# Patient Record
Sex: Female | Born: 1985 | Race: Black or African American | Hispanic: No | Marital: Single | Smoking: Former smoker
Health system: Southern US, Community
[De-identification: ages and names within clinical notes are randomized; demographics above are authoritative.]

## PROBLEM LIST (undated history)

## (undated) ENCOUNTER — Inpatient Hospital Stay (HOSPITAL_COMMUNITY): Payer: Self-pay

## (undated) DIAGNOSIS — D649 Anemia, unspecified: Secondary | ICD-10-CM

## (undated) DIAGNOSIS — F419 Anxiety disorder, unspecified: Secondary | ICD-10-CM

## (undated) HISTORY — PX: WISDOM TOOTH EXTRACTION: SHX21

---
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
## 2007-01-11 ENCOUNTER — Inpatient Hospital Stay (HOSPITAL_COMMUNITY): Admission: AD | Admit: 2007-01-11 | Discharge: 2007-01-11 | Payer: Self-pay | Admitting: Obstetrics

## 2007-01-24 ENCOUNTER — Inpatient Hospital Stay (HOSPITAL_COMMUNITY): Admission: AD | Admit: 2007-01-24 | Discharge: 2007-01-24 | Payer: Self-pay | Admitting: Obstetrics

## 2007-01-26 ENCOUNTER — Inpatient Hospital Stay (HOSPITAL_COMMUNITY): Admission: AD | Admit: 2007-01-26 | Discharge: 2007-01-29 | Payer: Self-pay | Admitting: Obstetrics

## 2007-04-12 ENCOUNTER — Inpatient Hospital Stay (HOSPITAL_COMMUNITY): Admission: AD | Admit: 2007-04-12 | Discharge: 2007-04-12 | Payer: Self-pay | Admitting: Obstetrics

## 2007-10-13 ENCOUNTER — Emergency Department (HOSPITAL_COMMUNITY): Admission: EM | Admit: 2007-10-13 | Discharge: 2007-10-13 | Payer: Self-pay | Admitting: Emergency Medicine

## 2007-12-11 ENCOUNTER — Inpatient Hospital Stay (HOSPITAL_COMMUNITY): Admission: AD | Admit: 2007-12-11 | Discharge: 2007-12-11 | Payer: Self-pay | Admitting: Obstetrics and Gynecology

## 2008-03-01 ENCOUNTER — Emergency Department (HOSPITAL_COMMUNITY): Admission: EM | Admit: 2008-03-01 | Discharge: 2008-03-01 | Payer: Self-pay | Admitting: Emergency Medicine

## 2008-03-30 ENCOUNTER — Emergency Department (HOSPITAL_COMMUNITY): Admission: EM | Admit: 2008-03-30 | Discharge: 2008-03-30 | Payer: Self-pay | Admitting: Emergency Medicine

## 2008-10-10 ENCOUNTER — Emergency Department (HOSPITAL_COMMUNITY): Admission: EM | Admit: 2008-10-10 | Discharge: 2008-10-10 | Payer: Self-pay | Admitting: Emergency Medicine

## 2009-04-12 ENCOUNTER — Emergency Department (HOSPITAL_COMMUNITY): Admission: EM | Admit: 2009-04-12 | Discharge: 2009-04-12 | Payer: Self-pay | Admitting: Emergency Medicine

## 2009-12-22 ENCOUNTER — Emergency Department (HOSPITAL_COMMUNITY): Admission: EM | Admit: 2009-12-22 | Discharge: 2009-12-22 | Payer: Self-pay | Admitting: Emergency Medicine

## 2010-03-31 ENCOUNTER — Ambulatory Visit: Payer: Self-pay | Admitting: Advanced Practice Midwife

## 2010-03-31 ENCOUNTER — Inpatient Hospital Stay (HOSPITAL_COMMUNITY): Admission: AD | Admit: 2010-03-31 | Discharge: 2010-03-31 | Payer: Self-pay | Admitting: Obstetrics & Gynecology

## 2010-04-22 ENCOUNTER — Ambulatory Visit: Payer: Self-pay | Admitting: Advanced Practice Midwife

## 2010-04-22 ENCOUNTER — Inpatient Hospital Stay (HOSPITAL_COMMUNITY): Admission: AD | Admit: 2010-04-22 | Discharge: 2010-04-22 | Payer: Self-pay | Admitting: Obstetrics and Gynecology

## 2010-05-03 ENCOUNTER — Encounter: Payer: Self-pay | Admitting: Obstetrics and Gynecology

## 2010-05-03 ENCOUNTER — Ambulatory Visit: Payer: Self-pay | Admitting: Obstetrics and Gynecology

## 2010-05-03 LAB — CONVERTED CEMR LAB
Amphetamine Screen, Ur: NEGATIVE
Barbiturate Quant, Ur: NEGATIVE
Benzodiazepines.: NEGATIVE
Marijuana Metabolite: NEGATIVE
Methadone: NEGATIVE
Pap Smear: NEGATIVE
Propoxyphene: NEGATIVE

## 2010-05-04 ENCOUNTER — Encounter: Payer: Self-pay | Admitting: Obstetrics and Gynecology

## 2010-05-04 LAB — CONVERTED CEMR LAB

## 2010-05-17 ENCOUNTER — Ambulatory Visit: Payer: Self-pay | Admitting: Obstetrics and Gynecology

## 2010-05-19 ENCOUNTER — Inpatient Hospital Stay (HOSPITAL_COMMUNITY)
Admission: AD | Admit: 2010-05-19 | Discharge: 2010-05-19 | Payer: Self-pay | Source: Home / Self Care | Attending: Obstetrics and Gynecology | Admitting: Obstetrics and Gynecology

## 2010-05-19 ENCOUNTER — Inpatient Hospital Stay (HOSPITAL_COMMUNITY)
Admission: AD | Admit: 2010-05-19 | Discharge: 2010-05-21 | Disposition: A | Discharge status: Still In Hospital | Payer: Self-pay | Source: Home / Self Care | Attending: Obstetrics & Gynecology | Admitting: Obstetrics & Gynecology

## 2010-05-24 ENCOUNTER — Ambulatory Visit: Payer: Self-pay | Admitting: Obstetrics and Gynecology

## 2010-06-16 ENCOUNTER — Ambulatory Visit
Admission: RE | Admit: 2010-06-16 | Discharge: 2010-06-16 | Payer: Self-pay | Source: Home / Self Care | Attending: Obstetrics and Gynecology | Admitting: Obstetrics and Gynecology

## 2010-08-21 LAB — CBC
HCT: 27.6 % — ABNORMAL LOW (ref 36.0–46.0)
Hemoglobin: 9.1 g/dL — ABNORMAL LOW (ref 12.0–15.0)
MCV: 86.9 fL (ref 78.0–100.0)
RBC: 3.18 MIL/uL — ABNORMAL LOW (ref 3.87–5.11)
RDW: 13.1 % (ref 11.5–15.5)
WBC: 10.3 10*3/uL (ref 4.0–10.5)

## 2010-08-21 LAB — POCT URINALYSIS DIPSTICK
Bilirubin Urine: NEGATIVE
Glucose, UA: NEGATIVE mg/dL
Ketones, ur: NEGATIVE mg/dL
Protein, ur: NEGATIVE mg/dL
Specific Gravity, Urine: 1.02 (ref 1.005–1.030)

## 2010-08-22 LAB — POCT URINALYSIS DIPSTICK
Bilirubin Urine: NEGATIVE
Glucose, UA: NEGATIVE mg/dL
Hgb urine dipstick: NEGATIVE
Nitrite: NEGATIVE
Specific Gravity, Urine: 1.02 (ref 1.005–1.030)
Urobilinogen, UA: 0.2 mg/dL (ref 0.0–1.0)

## 2010-08-22 LAB — URINE MICROSCOPIC-ADD ON

## 2010-08-22 LAB — URINALYSIS, ROUTINE W REFLEX MICROSCOPIC
Glucose, UA: NEGATIVE mg/dL
Protein, ur: NEGATIVE mg/dL
Specific Gravity, Urine: >1.03 — ABNORMAL HIGH (ref 1.005–1.030)
pH: 6 (ref 5.0–8.0)

## 2010-08-22 LAB — URINE CULTURE

## 2010-08-23 LAB — CBC
HCT: 26.7 % — ABNORMAL LOW (ref 36.0–46.0)
Hemoglobin: 9.1 g/dL — ABNORMAL LOW (ref 12.0–15.0)
MCH: 31.4 pg (ref 26.0–34.0)
MCV: 92.3 fL (ref 78.0–100.0)
RBC: 2.9 MIL/uL — ABNORMAL LOW (ref 3.87–5.11)
WBC: 9.3 10*3/uL (ref 4.0–10.5)

## 2010-08-23 LAB — RAPID URINE DRUG SCREEN, HOSP PERFORMED
Barbiturates: NOT DETECTED
Opiates: NOT DETECTED

## 2010-08-23 LAB — URINALYSIS, ROUTINE W REFLEX MICROSCOPIC
Glucose, UA: NEGATIVE mg/dL
Protein, ur: NEGATIVE mg/dL
Specific Gravity, Urine: 1.02 (ref 1.005–1.030)
Urobilinogen, UA: 1 mg/dL (ref 0.0–1.0)

## 2010-08-23 LAB — SICKLE CELL SCREEN: Sickle Cell Screen: NEGATIVE

## 2010-08-23 LAB — HEPATITIS B SURFACE ANTIGEN: Hepatitis B Surface Ag: NEGATIVE

## 2010-08-23 LAB — DIFFERENTIAL
Eosinophils Relative: 1 % (ref 0–5)
Lymphocytes Relative: 16 % (ref 12–46)
Lymphs Abs: 1.5 10*3/uL (ref 0.7–4.0)
Monocytes Absolute: 0.8 10*3/uL (ref 0.1–1.0)
Monocytes Relative: 8 % (ref 3–12)

## 2010-10-16 ENCOUNTER — Inpatient Hospital Stay (HOSPITAL_COMMUNITY)
Admission: AD | Admit: 2010-10-16 | Discharge: 2010-10-16 | Disposition: A | Discharge status: Home / Self Care | Payer: Self-pay | Source: Ambulatory Visit | Attending: Obstetrics & Gynecology | Admitting: Obstetrics & Gynecology

## 2010-10-16 DIAGNOSIS — N760 Acute vaginitis: Secondary | ICD-10-CM | POA: Insufficient documentation

## 2010-10-16 DIAGNOSIS — B9689 Other specified bacterial agents as the cause of diseases classified elsewhere: Secondary | ICD-10-CM | POA: Insufficient documentation

## 2010-10-16 DIAGNOSIS — N949 Unspecified condition associated with female genital organs and menstrual cycle: Secondary | ICD-10-CM | POA: Insufficient documentation

## 2010-10-16 DIAGNOSIS — A499 Bacterial infection, unspecified: Secondary | ICD-10-CM

## 2010-10-16 LAB — WET PREP, GENITAL: Trich, Wet Prep: NONE SEEN

## 2010-10-17 LAB — GC/CHLAMYDIA PROBE AMP, GENITAL: Chlamydia, DNA Probe: NEGATIVE

## 2011-03-12 LAB — URINALYSIS, ROUTINE W REFLEX MICROSCOPIC
Glucose, UA: NEGATIVE
Ketones, ur: NEGATIVE
Protein, ur: 30 — AB

## 2011-03-12 LAB — COMPREHENSIVE METABOLIC PANEL
AST: 19
Albumin: 3.8
BUN: 13
Calcium: 8.9
Chloride: 105
Creatinine, Ser: 0.67
GFR calc Af Amer: >60
Total Protein: 6.6

## 2011-03-12 LAB — URINE CULTURE: Colony Count: >=100000

## 2011-03-12 LAB — DIFFERENTIAL
Basophils Absolute: 0
Eosinophils Relative: 0
Lymphocytes Relative: 13
Lymphs Abs: 1.4
Monocytes Absolute: 0.5
Monocytes Relative: 5
Neutro Abs: 9.2 — ABNORMAL HIGH

## 2011-03-12 LAB — URINE MICROSCOPIC-ADD ON

## 2011-03-12 LAB — CBC
HCT: 35.6 — ABNORMAL LOW
MCHC: 32.5
MCV: 87.4
Platelets: 153
RDW: 13.9
WBC: 11.1 — ABNORMAL HIGH

## 2011-03-20 LAB — URINALYSIS, ROUTINE W REFLEX MICROSCOPIC
Hgb urine dipstick: NEGATIVE
Specific Gravity, Urine: >1.03 — ABNORMAL HIGH
Urobilinogen, UA: 0.2
pH: 6

## 2011-03-20 LAB — POCT PREGNANCY, URINE
Operator id: 117411
Preg Test, Ur: NEGATIVE

## 2011-03-23 LAB — CBC
HCT: 26.3 — ABNORMAL LOW
MCHC: 34
MCHC: 34
MCV: 84.9
MCV: 85.6
Platelets: 226
RBC: 3.48 — ABNORMAL LOW
RDW: 13.1

## 2011-03-23 LAB — RPR: RPR Ser Ql: NONREACTIVE

## 2011-03-26 LAB — URINE CULTURE
Colony Count: NO GROWTH
Culture: NO GROWTH

## 2011-03-26 LAB — URINALYSIS, ROUTINE W REFLEX MICROSCOPIC
Bilirubin Urine: NEGATIVE
Ketones, ur: NEGATIVE
Ketones, ur: 15 — AB
Nitrite: NEGATIVE
Nitrite: NEGATIVE
Protein, ur: NEGATIVE
Protein, ur: NEGATIVE
Specific Gravity, Urine: <1.005 — ABNORMAL LOW
Urobilinogen, UA: 0.2

## 2011-03-28 LAB — URINALYSIS, ROUTINE W REFLEX MICROSCOPIC
Bilirubin Urine: NEGATIVE
Hgb urine dipstick: NEGATIVE
Ketones, ur: NEGATIVE
Nitrite: NEGATIVE
Urobilinogen, UA: 0.2

## 2012-03-27 ENCOUNTER — Emergency Department (HOSPITAL_COMMUNITY)
Admission: EM | Admit: 2012-03-27 | Discharge: 2012-03-27 | Disposition: A | Discharge status: Home / Self Care | Payer: Self-pay | Attending: Emergency Medicine | Admitting: Emergency Medicine

## 2012-03-27 DIAGNOSIS — H00019 Hordeolum externum unspecified eye, unspecified eyelid: Secondary | ICD-10-CM | POA: Insufficient documentation

## 2012-03-27 DIAGNOSIS — H5710 Ocular pain, unspecified eye: Secondary | ICD-10-CM | POA: Insufficient documentation

## 2012-03-27 MED ORDER — ERYTHROMYCIN 5 MG/GM OP OINT
TOPICAL_OINTMENT | Freq: Once | OPHTHALMIC | Status: AC
  Administered 2012-03-27: 17:00:00 via OPHTHALMIC
  Filled 2012-03-27: qty 3.5

## 2012-03-27 NOTE — ED Notes (Signed)
Pt verbalizes understanding 

## 2012-03-27 NOTE — ED Notes (Signed)
Pt present with right eye ? stye.  Pt reports itching and burning.  Pt denies drainage or blurry vision.

## 2012-03-27 NOTE — ED Provider Notes (Signed)
History     CSN: [id number]  Arrival date & time 03/27/12  1442   First MD Initiated Contact with Patient 03/27/12 1548      Chief Complaint  Patient presents with  . Eye Pain    (Consider location/radiation/quality/duration/timing/severity/associated sxs/prior treatment) HPI Comments: Patient presents with complaint of right upper eyelid swelling for the past 2-3 days. It is itchy. She does not wear contact lenses and does not think she has gotten a foreign body in her eye. It has not been painful. No drainage from the area. She denies redness of her eyes or blurry vision. No crusting or drainage. Patient has had styes in the past that have resolved spontaneously. This is more severe than previous. Patient has used Visine but this has not helped. Onset gradual. Course is constant. Nothing makes symptoms better or worse.  The history is provided by the patient.    No past medical history on file.  No past surgical history on file.  No family history on file.  History  Substance Use Topics  . Smoking status: Not on file  . Smokeless tobacco: Not on file  . Alcohol Use: Not on file    OB History    No data available      Review of Systems  Constitutional: Negative for fever.  Eyes: Positive for itching. Negative for photophobia, pain, discharge, redness and visual disturbance.  Gastrointestinal: Negative for nausea and vomiting.  Skin: Negative for color change.  Neurological: Negative for headaches.    Allergies  Review of patient's allergies indicates no known allergies.  Home Medications   Current Outpatient Rx  Name Route Sig Dispense Refill  . ACETAMINOPHEN 500 MG PO TABS Oral Take 500 mg by mouth every 6 (six) hours as needed. pain    . MEDROXYPROGESTERONE ACETATE 150 MG/ML IM SUSP Intramuscular Inject 150 mg into the muscle every 3 (three) months.    . TETRAHYDROZOLINE HCL 0.05 % OP SOLN Right Eye Place 1 drop into the right eye 2 (two) times daily as  needed. Redness      BP 101/74  Pulse 94  Temp 97.9 F (36.6 C) (Oral)  Resp 14  SpO2 100%  Physical Exam  Nursing note and vitals reviewed. Constitutional: She appears well-developed and well-nourished.  HENT:  Head: Normocephalic and atraumatic.  Eyes: Conjunctivae normal and EOM are normal. Pupils are equal, round, and reactive to light. Right eye exhibits hordeolum. No foreign body present in the right eye. Left eye exhibits no hordeolum. No foreign body present in the left eye. Right conjunctiva is not injected. Left conjunctiva is not injected.    Neck: Normal range of motion. Neck supple.  Pulmonary/Chest: No respiratory distress.  Neurological: She is alert.  Skin: Skin is warm and dry.  Psychiatric: She has a normal mood and affect.    ED Course  Procedures (including critical care time)  Labs Reviewed - No data to display No results found.   1. Stye    4:49 PM Patient seen and examined.    Vital signs reviewed and are as follows: Filed Vitals:   03/27/12 1529  BP: 101/74  Pulse: 94  Temp: 97.9 F (36.6 C)  Resp: 14   Counseled on use of warm compresses.   Patient counseled to use tobrex as follows: 0.25 inch ribbon in affected eye(s) up to 6 times a day.  Patient urged to return with worsening symptoms or other concerns. Patient verbalized understanding and agrees with plan.  MDM  Stye, right upper eyelid. No s/s of periorbital cellulitis. Eye exam otherwise normal.         Renne Crigler, PA 03/27/12 1654

## 2012-03-29 NOTE — ED Provider Notes (Signed)
Medical screening examination/treatment/procedure(s) were performed by non-physician practitioner and as supervising physician I was immediately available for consultation/collaboration.   Hurman Horn, MD 03/29/12 1539

## 2012-07-29 ENCOUNTER — Encounter (HOSPITAL_COMMUNITY): Payer: Self-pay | Admitting: Emergency Medicine

## 2012-07-29 ENCOUNTER — Emergency Department (HOSPITAL_COMMUNITY)
Admission: EM | Admit: 2012-07-29 | Discharge: 2012-07-29 | Discharge status: Against Medical Advice | Payer: Self-pay | Attending: Emergency Medicine | Admitting: Emergency Medicine

## 2012-07-29 DIAGNOSIS — R0602 Shortness of breath: Secondary | ICD-10-CM | POA: Insufficient documentation

## 2012-07-29 DIAGNOSIS — R109 Unspecified abdominal pain: Secondary | ICD-10-CM | POA: Insufficient documentation

## 2012-07-29 DIAGNOSIS — F17200 Nicotine dependence, unspecified, uncomplicated: Secondary | ICD-10-CM | POA: Insufficient documentation

## 2012-07-29 DIAGNOSIS — R197 Diarrhea, unspecified: Secondary | ICD-10-CM | POA: Insufficient documentation

## 2012-07-29 DIAGNOSIS — R110 Nausea: Secondary | ICD-10-CM | POA: Insufficient documentation

## 2012-07-29 DIAGNOSIS — Z3202 Encounter for pregnancy test, result negative: Secondary | ICD-10-CM | POA: Insufficient documentation

## 2012-07-29 DIAGNOSIS — R079 Chest pain, unspecified: Secondary | ICD-10-CM | POA: Insufficient documentation

## 2012-07-29 LAB — URINALYSIS, ROUTINE W REFLEX MICROSCOPIC
Bilirubin Urine: NEGATIVE
Hgb urine dipstick: NEGATIVE
Protein, ur: NEGATIVE mg/dL
Urobilinogen, UA: 0.2 mg/dL (ref 0.0–1.0)

## 2012-07-29 LAB — URINE MICROSCOPIC-ADD ON

## 2012-07-29 NOTE — ED Notes (Signed)
Pt c/o diarrhea and nausea starting yesterday

## 2012-07-29 NOTE — ED Notes (Signed)
Pt stated she had to go pick up her children from school and had to leave.

## 2012-07-30 ENCOUNTER — Emergency Department (HOSPITAL_COMMUNITY): Admit: 2012-07-30 | Discharge: 2012-07-30 | Disposition: A | Discharge status: Home / Self Care | Payer: Self-pay

## 2012-07-30 ENCOUNTER — Encounter (HOSPITAL_COMMUNITY): Payer: Self-pay | Admitting: Emergency Medicine

## 2012-07-30 ENCOUNTER — Emergency Department (HOSPITAL_COMMUNITY)
Admission: EM | Admit: 2012-07-30 | Discharge: 2012-07-30 | Discharge status: Against Medical Advice | Payer: Self-pay | Attending: Emergency Medicine | Admitting: Emergency Medicine

## 2012-07-30 DIAGNOSIS — R079 Chest pain, unspecified: Secondary | ICD-10-CM

## 2012-07-30 LAB — CBC WITH DIFFERENTIAL/PLATELET
Basophils Absolute: 0 10*3/uL (ref 0.0–0.1)
Basophils Relative: 1 % (ref 0–1)
Eosinophils Absolute: 0.1 10*3/uL (ref 0.0–0.7)
Eosinophils Relative: 2 % (ref 0–5)
Lymphs Abs: 3 10*3/uL (ref 0.7–4.0)
MCH: 29.6 pg (ref 26.0–34.0)
MCV: 90.1 fL (ref 78.0–100.0)
Neutrophils Relative %: 52 % (ref 43–77)
Platelets: 203 10*3/uL (ref 150–400)
RBC: 4.43 MIL/uL (ref 3.87–5.11)
RDW: 13.2 % (ref 11.5–15.5)

## 2012-07-30 LAB — COMPREHENSIVE METABOLIC PANEL
AST: 15 U/L (ref 0–37)
Alkaline Phosphatase: 76 U/L (ref 39–117)
BUN: 15 mg/dL (ref 6–23)
CO2: 28 mEq/L (ref 19–32)
Chloride: 106 mEq/L (ref 96–112)
Creatinine, Ser: 0.85 mg/dL (ref 0.50–1.10)
GFR calc non Af Amer: >90 mL/min (ref >90)
Potassium: 4.2 mEq/L (ref 3.5–5.1)
Total Bilirubin: 0.2 mg/dL — ABNORMAL LOW (ref 0.3–1.2)

## 2012-07-30 LAB — POCT I-STAT TROPONIN I: Troponin i, poc: 0 ng/mL (ref 0.00–0.08)

## 2012-07-30 MED ORDER — SODIUM CHLORIDE 0.9 % IV BOLUS (SEPSIS): 500.0000 mL | Freq: Once | INTRAVENOUS | Status: DC

## 2012-07-30 MED ORDER — GI COCKTAIL ~~LOC~~
30.0000 mL | Freq: Once | ORAL | Status: AC
  Administered 2012-07-30: 30 mL via ORAL
  Filled 2012-07-30: qty 30

## 2012-07-30 MED ORDER — PANTOPRAZOLE SODIUM 40 MG PO TBEC
40.0000 mg | DELAYED_RELEASE_TABLET | Freq: Once | ORAL | Status: AC
  Administered 2012-07-30: 40 mg via ORAL
  Filled 2012-07-30: qty 1

## 2012-07-30 MED ORDER — ESOMEPRAZOLE MAGNESIUM 40 MG PO CPDR: 40.0000 mg | DELAYED_RELEASE_CAPSULE | Freq: Every day | ORAL | Status: DC

## 2012-07-30 NOTE — ED Notes (Signed)
PT. REPORTS INTERMITTENT LEFT CHEST PAIN WITH SLIGHT SOB AND NAUSEA ONSET THIS EVENING , ALSO REPORTS DIARRHEA LAST NIGHT.

## 2012-07-30 NOTE — ED Notes (Signed)
Patient states that she has to go home and cannot wait for the lab results to come back.  Patient states that there is no one to watch her baby and she must go home.  Explained to patient risk of her going home and symptoms getting worse-possibility of death.  Patient verbalized understanding of the risk and states that she will return if her symptoms worsen.  MD updated.

## 2012-07-30 NOTE — ED Provider Notes (Signed)
History     CSN: [id number]  Arrival date & time 07/29/12  2346   First MD Initiated Contact with Patient 07/30/12 0038      Chief Complaint  Patient presents with  . Chest Pain    (Consider location/radiation/quality/duration/timing/severity/associated sxs/prior treatment) Patient is a 27 y.o. female presenting with chest pain. The history is provided by the patient.  Chest Pain Pain location:  Substernal area Pain quality: aching   Pain radiates to:  Does not radiate Pain radiates to the back: no   Pain severity:  Mild Onset quality:  Gradual Duration:  1 day Timing:  Constant Progression:  Partially resolved Chronicity:  New Context: breathing   Relieved by:  None tried Worsened by:  Nothing tried Ineffective treatments:  None tried Associated symptoms: abdominal pain   Risk factors: birth control     History reviewed. No pertinent past medical history.  History reviewed. No pertinent past surgical history.  No family history on file.  History  Substance Use Topics  . Smoking status: Current Every Day Smoker  . Smokeless tobacco: Not on file  . Alcohol Use: Yes     Comment: occ    OB History   Grav Para Term Preterm Abortions TAB SAB Ect Mult Living                  Review of Systems  Cardiovascular: Positive for chest pain.  Gastrointestinal: Positive for abdominal pain.  All other systems reviewed and are negative.    Allergies  Review of patient's allergies indicates no known allergies.  Home Medications   Current Outpatient Rx  Name  Route  Sig  Dispense  Refill  . acetaminophen (TYLENOL) 500 MG tablet   Oral   Take 500 mg by mouth every 6 (six) hours as needed. pain         . medroxyPROGESTERone (DEPO-PROVERA) 150 MG/ML injection   Intramuscular   Inject 150 mg into the muscle every 3 (three) months.           BP 107/71  Pulse 103  Temp(Src) 98.5 F (36.9 C) (Oral)  Resp 14  SpO2 100%  Physical Exam  Constitutional:  She is oriented to person, place, and time. She appears well-developed and well-nourished.  HENT:  Head: Normocephalic and atraumatic.  Eyes: Conjunctivae and EOM are normal. Pupils are equal, round, and reactive to light.  Neck: Normal range of motion.  Cardiovascular: Normal rate, regular rhythm and normal heart sounds.   Pulmonary/Chest: Effort normal and breath sounds normal.  Abdominal: Soft. Bowel sounds are normal.  Musculoskeletal: Normal range of motion.  Neurological: She is alert and oriented to person, place, and time.  Skin: Skin is warm and dry.  Psychiatric: She has a normal mood and affect. Her behavior is normal.    ED Course  Procedures (including critical care time)  Labs Reviewed  COMPREHENSIVE METABOLIC PANEL - Abnormal; Notable for the following:    Total Bilirubin 0.2 (*)    All other components within normal limits  CBC WITH DIFFERENTIAL  D-DIMER, QUANTITATIVE   Dg Chest 2 View  07/30/2012  *RADIOLOGY REPORT*  Clinical Data: Chest pain  CHEST - 2 VIEW  Comparison: None.  Findings: The lungs are well-aerated and free from pulmonary edema, focal airspace consolidation or pulmonary nodule.  Cardiac and mediastinal contours are within normal limits.  No pneumothorax, or pleural effusion. No acute osseous findings.  IMPRESSION:  No acute cardiopulmonary disease.   Original Report Authenticated By:  Malachy Moan, M.D.      No diagnosis found.    Date: 07/30/2012  Rate: 95  Rhythm: normal sinus rhythm  QRS Axis: normal  Intervals: normal  ST/T Wave abnormalities: normal  Conduction Disutrbances: none  Narrative Interpretation: unremarkable    MDM  + nausea,  Diarrhea, sob, cp, on ocp,  Will d-dimer, ivf,  reassess        Izell Labat Lytle Michaels, MD 07/30/12 0128

## 2012-07-31 LAB — URINE CULTURE

## 2012-08-05 ENCOUNTER — Telehealth (HOSPITAL_COMMUNITY): Payer: Self-pay | Admitting: Emergency Medicine

## 2012-08-05 NOTE — ED Notes (Signed)
Chart reviewed by Dr Effie Shy "Call in Rx Keflex 500 mg QID, # 28 until gone".. Pt informed of Dx and need for addl tx.  Rx called to [facility] pharmacist @ [phone].

## 2015-09-06 ENCOUNTER — Emergency Department (HOSPITAL_COMMUNITY)
Admission: EM | Admit: 2015-09-06 | Discharge: 2015-09-06 | Disposition: A | Discharge status: Home / Self Care | Payer: 59 | Attending: Emergency Medicine | Admitting: Emergency Medicine

## 2015-09-06 ENCOUNTER — Encounter (HOSPITAL_COMMUNITY): Payer: Self-pay

## 2015-09-06 DIAGNOSIS — R200 Anesthesia of skin: Secondary | ICD-10-CM | POA: Insufficient documentation

## 2015-09-06 DIAGNOSIS — F17200 Nicotine dependence, unspecified, uncomplicated: Secondary | ICD-10-CM | POA: Diagnosis not present

## 2015-09-06 DIAGNOSIS — F410 Panic disorder [episodic paroxysmal anxiety] without agoraphobia: Secondary | ICD-10-CM | POA: Diagnosis not present

## 2015-09-06 DIAGNOSIS — R109 Unspecified abdominal pain: Secondary | ICD-10-CM | POA: Insufficient documentation

## 2015-09-06 NOTE — ED Notes (Signed)
Per EMS, pt from home.  Pt found anxious on floor.  Pt was c/o extremity numbness with abdominal discomfort.  Mom took kids and told EMS that pt may have been drinking.  Vitals stable within route.  Vitals: 132/96, hr 100, resp 40,

## 2015-09-06 NOTE — ED Notes (Signed)
Pt not found in room and had left.  MD notified.

## 2015-09-09 ENCOUNTER — Ambulatory Visit (INDEPENDENT_AMBULATORY_CARE_PROVIDER_SITE_OTHER): Payer: 59 | Admitting: Physician Assistant

## 2015-09-09 VITALS — BP 120/76 | HR 78 | Temp 98.6°F | Resp 16

## 2015-09-09 DIAGNOSIS — F410 Panic disorder [episodic paroxysmal anxiety] without agoraphobia: Secondary | ICD-10-CM

## 2015-09-09 DIAGNOSIS — G4700 Insomnia, unspecified: Secondary | ICD-10-CM | POA: Diagnosis not present

## 2015-09-09 DIAGNOSIS — F430 Acute stress reaction: Principal | ICD-10-CM

## 2015-09-09 MED ORDER — HYDROXYZINE HCL 25 MG PO TABS: ORAL_TABLET | ORAL | Status: DC

## 2015-09-09 NOTE — Progress Notes (Signed)
[facility] [address], [place] [address] [phone]  Date:  09/09/2015   Name:  Donna Lowery   DOB:  08/13/1985   MRN:  [id number]  PCP:  Default, Provider, MD    Chief Complaint: Anxiety   History of Present Illness:  This is a 30 y.o. female with PMH tobacco abuse who is presenting with anxiety. States she had a panic attack 3 days ago for the first time ever. She had sob and started hyperventilating. Her son called 911. She was transported to [facility] long ED. By the time she got there, she was able to regulate her breathing and assumed she had just had a panic attack. She decided to leave before being seen. Last night she had another panic attack but states it was not as bad. She states she has been under a lot of stress lately. Her grandmother died 1 year ago and they are finally getting trying to get her house packed and sold. Her mother and sister recently moved in with her and she states "I'm more like a mother to both of them". She has two children of her own as well. She states her mood is overall good but states she is always worrying and making to-do-lists. Over the past couple months her sleeping patterns have worsened. She gets off work at 830 and is unable to fall asleep until 430 AM. She then has to get up at 630 AM to get her kids ready for school. She states she will stay up looking at her phone in bed. She has not tried anything for sleep. She states she is not a pill person and would prefer to not take anything.  She is wondering about getting medical leave for work, 3/28 - 4/10.   Review of Systems:  Review of Systems See HPI  There are no active problems to display for this patient.   Prior to Admission medications   Medication Sig Start Date End Date Taking? Authorizing Provider  levonorgestrel (MIRENA) 20 MCG/24HR IUD 1 each by Intrauterine route once. Ongoing   Yes Historical Provider, MD           No Known Allergies  History  reviewed. No pertinent past surgical history.  Social History  Substance Use Topics  . Smoking status: Current Every Day Smoker  . Smokeless tobacco: None  . Alcohol Use: Yes     Comment: occ    History reviewed. No pertinent family history.  Medication list has been reviewed and updated.  Physical Examination:  Physical Exam  Constitutional: She is oriented to person, place, and time. She appears well-developed and well-nourished. No distress.  HENT:  Head: Normocephalic and atraumatic.  Right Ear: Hearing normal.  Left Ear: Hearing normal.  Nose: Nose normal.  Eyes: Conjunctivae and lids are normal. Right eye exhibits no discharge. Left eye exhibits no discharge. No scleral icterus.  Pulmonary/Chest: Effort normal. No respiratory distress.  Musculoskeletal: Normal range of motion.  Neurological: She is alert and oriented to person, place, and time.  Skin: Skin is warm, dry and intact. No lesion and no rash noted.  Psychiatric: She has a normal mood and affect. Her speech is normal and behavior is normal. Thought content normal.    BP 120/76 mmHg  Pulse 78  Temp(Src) 98.6 F (37 C) (Oral)  Resp 16  SpO2 98%  Assessment and Plan:  1. Panic attack as reaction to stress 2. Insomnia  Pt is wary of medicine  and wants to try more conservative measures first. We discussed the importance of regular exercise and taking time for herself. We discussed meditation. Deep breathing if have a panic attack. Gave rx for vistaril to try prn panic attack. Counseled on sleep hygiene. May try melatonin 30 minutes prior to sleep. i am ok with giving short term FMLA. She will return with forms. Follow up in 1 month. - hydrOXYzine (ATARAX/VISTARIL) 25 MG tablet; Take 0.5-1 tablets po every 8 hours prn anxiety  Dispense: 30 tablet; Refill: 0   [place] Miners. Dyke Brackett, MHS [facility]  09/09/2015

## 2015-09-09 NOTE — Patient Instructions (Addendum)
Exercise most days, at least 4 days a week. Meditation can help. Sleep hygiene - no phone, computer, tv, reading in bed. Try to go to sleep for 20 minutes, if unable leave the bedroom. Return 20-30 minutes later and try again May take melatonin 5 mg 30 minutes before bed. If you have a panic attack, remember you are in control, focus on deep breathing Return with your FMLA forms. Return in 1 month to follow up.

## 2015-09-22 ENCOUNTER — Telehealth: Payer: Self-pay

## 2015-09-22 NOTE — Telephone Encounter (Signed)
FMLA completed and placed in FMLA box.

## 2015-09-22 NOTE — Telephone Encounter (Signed)
PATIENT STATES SHE SAW NICOLE BUSH ABOUT 2 WEEKS AGO FOR ANXIETY. SHE ASKED HER IF SHE WOULD LIKE TO TALK WITH A THERAPIST. SHE HAS DECIDED THAT SHE WOULD LIKE TO. BEST PHONE [phone] (CELL)  MBC

## 2015-09-22 NOTE — Telephone Encounter (Signed)
Patient needs FMLA forms completed by Lanier ClamNicole Bush, I have completed what I could from the OV notes from 09/09/15 and highlighted the areas that need to be completed. I will place them in your box on 09/22/15 please fill out and return to the FMLA/Disability box at the 102 checkout desk  Within 5-7 business days. Thank you!

## 2015-09-22 NOTE — Telephone Encounter (Signed)
For therapy -- [facility] ([address], Ernest McCoy, Heather Joycelyn SchmidKitchens, Karla Jordan [place]) - [phone] [facility] Raynelle Fanning([place]) - [phone] [facility] - [phone] [facility] - [phone] (do not file insurance) [facility] - accepts most major insurances. [phone] or email [email or web address]   You can give her the above contact info

## 2015-09-23 NOTE — Telephone Encounter (Signed)
Paperwork scanned and faxed to [employer] on 09/23/15

## 2015-09-23 NOTE — Telephone Encounter (Signed)
Pt called back asking about the previous message and i gave her the names for therapy that Joni Reiningicole listed

## 2015-10-25 ENCOUNTER — Telehealth: Payer: Self-pay

## 2015-10-25 NOTE — Telephone Encounter (Signed)
[employer] is requesting some more information on this patients Leave. I have completed what I could from the OV notes and the other FMLA forms I highlighted the areas that need to be updated. I will please it in your box on 10-25-15 if you could please return this to the FMLA/Disability box at the 102 check out desk with in 5-7 business day thank you.

## 2015-10-26 DIAGNOSIS — Z0271 Encounter for disability determination: Secondary | ICD-10-CM

## 2015-10-27 NOTE — Telephone Encounter (Signed)
Completed. In FMLA box.

## 2015-10-31 NOTE — Telephone Encounter (Signed)
Paperwork scanned in and faxed to company on 10/31/15

## 2016-11-12 ENCOUNTER — Encounter (HOSPITAL_COMMUNITY): Payer: Self-pay | Admitting: Emergency Medicine

## 2016-11-12 ENCOUNTER — Emergency Department (HOSPITAL_COMMUNITY)
Admission: EM | Admit: 2016-11-12 | Discharge: 2016-11-12 | Disposition: A | Discharge status: Home / Self Care | Payer: 59 | Attending: Emergency Medicine | Admitting: Emergency Medicine

## 2016-11-12 DIAGNOSIS — F419 Anxiety disorder, unspecified: Secondary | ICD-10-CM | POA: Insufficient documentation

## 2016-11-12 DIAGNOSIS — Z79899 Other long term (current) drug therapy: Secondary | ICD-10-CM | POA: Insufficient documentation

## 2016-11-12 DIAGNOSIS — F17200 Nicotine dependence, unspecified, uncomplicated: Secondary | ICD-10-CM | POA: Diagnosis not present

## 2016-11-12 HISTORY — DX: Anxiety disorder, unspecified: F41.9

## 2016-11-12 MED ORDER — HYDROXYZINE HCL 25 MG PO TABS: 25.0000 mg | ORAL_TABLET | Freq: Three times a day (TID) | ORAL | 0 refills | Status: DC | PRN

## 2016-11-12 NOTE — ED Triage Notes (Signed)
Pt reports "anxiety attack" yesterday, reports first symptoms since July 2017.  Pt reports palpitations with SOB, lasting about 20-30 minutes, reports all symptoms resolved at this time. Pt reports out of anxiety meds.  Resp e/u, NAD noted.

## 2016-11-12 NOTE — Discharge Instructions (Signed)
You have been seen today with complaint of anxiety. Your hydroxyzine has been renewed. A consult for social work and case management has been placed on your behalf. Resources attached to this discharge paperwork have been included for your benefit to give you a guide for securing a provider to manage this issue. Many of the resources listed perform functions for things like anxiety (pertinent to you) as well as substance abuse (not pertinent to you). Please contact them to work on setting up an appointment.

## 2016-11-12 NOTE — Discharge Planning (Signed)
[facility] regarding resources for anxiety.  EDSW will provide resources.

## 2016-11-12 NOTE — ED Provider Notes (Signed)
[facility]-EMERGENCY DEPT Provider Note   CSN: [id number] Arrival date & time: 11/12/16  0730     History   Chief Complaint Chief Complaint  Patient presents with  . Anxiety    HPI WRENLEY SAYED is a 31 y.o. female.  HPI   ERCELL PERLMAN is a 32 y.o. female, with a history of anxiety, presenting to the ED with complaint of an anxiety attack that occurred yesterday. She states she was washing dishes, began to breathe heavily, and her heart began to race. Lasted for about 20-30 minutes and then resolved completely. She has been under extra stress lately. Endorses insomnia. She denies current complaints. States she would like a referral to a provider to handle this. She states the provider she was seeing is now out of network for her. She inquires about [facility] services. Denies SI/HI. Denies illicit drug use. Occasional alcohol use. Denies A/V hallucinations.   Patient states she has had previous success with hydroxyzine.   Past Medical History:  Diagnosis Date  . Anxiety     There are no active problems to display for this patient.   History reviewed. No pertinent surgical history.  OB History    No data available       Home Medications    Prior to Admission medications   Medication Sig Start Date End Date Taking? Authorizing Provider  esomeprazole (NEXIUM) 40 MG capsule Take 1 capsule (40 mg total) by mouth daily. Patient not taking: Reported on 09/06/2015 07/30/12   Mendel Ryder, MD  hydrOXYzine (ATARAX/VISTARIL) 25 MG tablet Take 0.5-1 tablets po every 8 hours prn anxiety 09/09/15   Dorna Leitz, PA-C  hydrOXYzine (ATARAX/VISTARIL) 25 MG tablet Take 1 tablet (25 mg total) by mouth every 8 (eight) hours as needed for anxiety. 11/12/16   Terris Germano C, PA-C  levonorgestrel (MIRENA) 20 MCG/24HR IUD 1 each by Intrauterine route once. Ongoing    [provider]    Family History No family history on file.  Social  History Social History  Substance Use Topics  . Smoking status: Current Every Day Smoker    Packs/day: 0.50  . Smokeless tobacco: Never Used  . Alcohol use Yes     Comment: occ     Allergies   Patient has no known allergies.   Review of Systems Review of Systems  Respiratory: Positive for shortness of breath (resolved).   Gastrointestinal: Negative for nausea and vomiting.  Psychiatric/Behavioral: Negative for agitation, dysphoric mood, hallucinations and suicidal ideas. The patient is nervous/anxious (resolved).   All other systems reviewed and are negative.    Physical Exam Updated Vital Signs BP 116/80 (BP Location: Left Arm)   Pulse 89   Temp 97.7 F (36.5 C) (Oral)   Resp 16   Ht 5\' 5"  (1.651 m)   Wt 49.9 kg (110 lb)   SpO2 99%   BMI 18.30 kg/m   Physical Exam  Constitutional: She appears well-developed and well-nourished. No distress.  HENT:  Head: Normocephalic and atraumatic.  Eyes: Conjunctivae are normal.  Neck: Neck supple.  Cardiovascular: Normal rate, regular rhythm, normal heart sounds and intact distal pulses.   Pulmonary/Chest: Effort normal and breath sounds normal. No respiratory distress.  Abdominal: Soft. There is no tenderness. There is no guarding.  Musculoskeletal: She exhibits no edema.  Lymphadenopathy:    She has no cervical adenopathy.  Neurological: She is alert.  Skin: Skin is warm and dry. She is not diaphoretic.  Psychiatric: She  has a normal mood and affect. Her behavior is normal.  Nursing note and vitals reviewed.    ED Treatments / Results  Labs (all labs ordered are listed, but only abnormal results are displayed) Labs Reviewed - No data to display  EKG  EKG Interpretation None       Radiology No results found.  Procedures Procedures (including critical care time)  Medications Ordered in ED Medications - No data to display   Initial Impression / Assessment and Plan / ED Course  I have reviewed the  triage vital signs and the nursing notes.  Pertinent labs & imaging results that were available during my care of the patient were reviewed by me and considered in my medical decision making (see chart for details).     Patient presents with complaint of an anxiety attack yesterday. No current complaints during the ED course. Case manager versus social work consult placed to help patient with resources. Hydroxyzine renewed. The patient was given instructions for home care as well as return precautions. Patient voices understanding of these instructions, accepts the plan, and is comfortable with discharge.  Final Clinical Impressions(s) / ED Diagnoses   Final diagnoses:  Anxiety    New Prescriptions New Prescriptions   HYDROXYZINE (ATARAX/VISTARIL) 25 MG TABLET    Take 1 tablet (25 mg total) by mouth every 8 (eight) hours as needed for anxiety.     Anselm PancoastJoy, Latyra Jaye C, PA-C 11/12/16 [phone]    Linwood DibblesKnapp, Jon, MD 11/13/16 [phone]

## 2016-11-28 ENCOUNTER — Ambulatory Visit (INDEPENDENT_AMBULATORY_CARE_PROVIDER_SITE_OTHER): Payer: 59 | Admitting: Physician Assistant

## 2016-11-28 ENCOUNTER — Encounter: Payer: Self-pay | Admitting: Physician Assistant

## 2016-11-28 VITALS — BP 100/67 | HR 90 | Temp 98.3°F | Resp 16 | Ht 65.0 in | Wt 108.4 lb

## 2016-11-28 DIAGNOSIS — F411 Generalized anxiety disorder: Secondary | ICD-10-CM

## 2016-11-28 MED ORDER — CLONAZEPAM 0.5 MG PO TABS: 0.2500 mg | ORAL_TABLET | Freq: Two times a day (BID) | ORAL | 0 refills | Status: DC | PRN

## 2016-11-28 MED ORDER — CITALOPRAM HYDROBROMIDE 20 MG PO TABS: 20.0000 mg | ORAL_TABLET | Freq: Every day | ORAL | 1 refills | Status: DC

## 2016-11-28 NOTE — Progress Notes (Signed)
PRIMARY CARE AT [facility] [address] [place] [address] [phone]  Date:  11/28/2016   Name:  Donna Lowery   DOB:  May 07, 1986   MRN:  [id number]  PCP:  Default, Provider, MD    History of Present Illness:  Donna Lowery is a 31 y.o. female patient who presents to PCP with  Chief Complaint  Patient presents with  . Anxiety    per patient, has had more frequent anxiety attacks; has taken a leave from work     [employer], [place] she notes to be pcp.   She is having anxiety attacks back to back that started 4 weeks ago.  Attack symptoms include nausea, dizziness, and sob.  She has some triggers, but it can be just sitting and thinking.  She will be thkinking about death.  Her cousin passed passed 1-2 months.  She is unable to work due to feeling overwhelmed.  A disgruntled patient causes tearful anxiety attacks, and panic.  No si/hi.  She was seen at the ED 2.5 weeks ago for anxiety.  Released within hours.  Given hydroxyzine.  States it does not help. Notes change in sleep (less), interest, psychomotor retardation, less appetite, and energy.  She has not seen any counseling, she will see a therapist in 2 days.  She is given 8 sessions per year for her insurance. .   1 year ago, she had a first panic attack.  She was home with her kids, and had an anxiety attack and syncope.   Mother has a hx of panic attacks.   No hx of ptsd.   She can describe feelings of incredible highs followed by lows without any suggested downturning event.   Some hx of impulsivity with money.  Can go to store with no true thought of what to obtain, and leave with 500.00 worth of purchases.   She endorses that she has had hx of auditory and visual hallucinations, where she can see her grandmother and hear her.  Reports no negative commands.  No family or personal hx of thyroid disease.    Sister with anxiety and depression.  She questions whether mother has diagnosis of bipolar   There are no  active problems to display for this patient.   Past Medical History:  Diagnosis Date  . Anxiety     No past surgical history on file.  Social History  Substance Use Topics  . Smoking status: Current Every Day Smoker    Packs/day: 1.00    Years: 10.00  . Smokeless tobacco: Never Used  . Alcohol use Yes     Comment: occasional    No family history on file.  No Known Allergies  Medication list has been reviewed and updated.  Current Outpatient Prescriptions on File Prior to Visit  Medication Sig Dispense Refill  . esomeprazole (NEXIUM) 40 MG capsule Take 1 capsule (40 mg total) by mouth daily. (Patient not taking: Reported on 09/06/2015) 30 capsule 0  . hydrOXYzine (ATARAX/VISTARIL) 25 MG tablet Take 0.5-1 tablets po every 8 hours prn anxiety 30 tablet 0  . hydrOXYzine (ATARAX/VISTARIL) 25 MG tablet Take 1 tablet (25 mg total) by mouth every 8 (eight) hours as needed for anxiety. 20 tablet 0  . levonorgestrel (MIRENA) 20 MCG/24HR IUD 1 each by Intrauterine route once. Ongoing     No current facility-administered medications on file prior to visit.     ROS ROS otherwise unremarkable unless listed above.  Physical Examination: BP 100/67 (BP  Location: Right Arm, Patient Position: Sitting, Cuff Size: Normal)   Pulse 90   Temp 98.3 F (36.8 C) (Oral)   Resp 16   Ht 5\' 5"  (1.651 m)   Wt 108 lb 6.4 oz (49.2 kg)   SpO2 98%   BMI 18.04 kg/m  Ideal Body Weight: Weight in (lb) to have BMI = 25: 149.9  Physical Exam  Constitutional: She is oriented to person, place, and time. She appears well-developed and well-nourished. No distress.  HENT:  Head: Normocephalic and atraumatic.  Right Ear: External ear normal.  Left Ear: External ear normal.  Eyes: Conjunctivae and EOM are normal. Pupils are equal, round, and reactive to light.  Cardiovascular: Normal rate, regular rhythm and intact distal pulses.  Exam reveals no friction rub.   No murmur heard. Pulmonary/Chest: Effort  normal. No respiratory distress. She has no wheezes.  Neurological: She is alert and oriented to person, place, and time. No cranial nerve deficit. Coordination normal.  Skin: She is not diaphoretic.  Psychiatric: She has a normal mood and affect. Her behavior is normal.     Assessment and Plan: Donna Lowery is a 31 y.o. female who is here today for cc of anxiety Patient will start the celexa at this time.  Advised to return in 1 week.  We need to get her motivated to maintain qol.  Given klonopin to bridge ssri optimal treatment.  Will obtain fmla.  More anxiety dx at this time with depressed state.  Major depression has noted to be in the last 4-6 weeks, and not quite criteria length.   25 minutes of direct patient care given.   Generalized anxiety disorder - Plan: TSH, citalopram (CELEXA) 20 MG tablet  Donna PlattStephanie Delorese Sellin, PA-C [facility] 6/24/20188:19 AM

## 2016-11-28 NOTE — Patient Instructions (Addendum)
Escitalopram tablets What is this medicine? ESCITALOPRAM (es sye TAL oh pram) is used to treat depression and certain types of anxiety. This medicine may be used for other purposes; ask your health care provider or pharmacist if you have questions. COMMON BRAND NAME(S): Lexapro What should I tell my health care provider before I take this medicine? They need to know if you have any of these conditions: -bipolar disorder or a family history of bipolar disorder -diabetes -glaucoma -heart disease -kidney or liver disease -receiving electroconvulsive therapy -seizures (convulsions) -suicidal thoughts, plans, or attempt by you or a family member -an unusual or allergic reaction to escitalopram, the related drug citalopram, other medicines, foods, dyes, or preservatives -pregnant or trying to become pregnant -breast-feeding How should I use this medicine? Take this medicine by mouth with a glass of water. Follow the directions on the prescription label. You can take it with or without food. If it upsets your stomach, take it with food. Take your medicine at regular intervals. Do not take it more often than directed. Do not stop taking this medicine suddenly except upon the advice of your doctor. Stopping this medicine too quickly may cause serious side effects or your condition may worsen. A special MedGuide will be given to you by the pharmacist with each prescription and refill. Be sure to read this information carefully each time. Talk to your pediatrician regarding the use of this medicine in children. Special care may be needed. Overdosage: If you think you have taken too much of this medicine contact a poison control center or emergency room at once. NOTE: This medicine is only for you. Do not share this medicine with others. What if I miss a dose? If you miss a dose, take it as soon as you can. If it is almost time for your next dose, take only that dose. Do not take double or extra  doses. What may interact with this medicine? Do not take this medicine with any of the following medications: -certain medicines for fungal infections like fluconazole, itraconazole, ketoconazole, posaconazole, voriconazole -cisapride -citalopram -dofetilide -dronedarone -linezolid -MAOIs like Carbex, Eldepryl, Marplan, Nardil, and Parnate -methylene blue (injected into a vein) -pimozide -thioridazine -ziprasidone This medicine may also interact with the following medications: -alcohol -amphetamines -aspirin and aspirin-like medicines -carbamazepine -certain medicines for depression, anxiety, or psychotic disturbances -certain medicines for migraine headache like almotriptan, eletriptan, frovatriptan, naratriptan, rizatriptan, sumatriptan, zolmitriptan -certain medicines for sleep -certain medicines that treat or prevent blood clots like warfarin, enoxaparin, dalteparin -cimetidine -diuretics -fentanyl -furazolidone -isoniazid -lithium -metoprolol -NSAIDs, medicines for pain and inflammation, like ibuprofen or naproxen -other medicines that prolong the QT interval (cause an abnormal heart rhythm) -procarbazine -rasagiline -supplements like St. John's wort, kava kava, valerian -tramadol -tryptophan This list may not describe all possible interactions. Give your health care provider a list of all the medicines, herbs, non-prescription drugs, or dietary supplements you use. Also tell them if you smoke, drink alcohol, or use illegal drugs. Some items may interact with your medicine. What should I watch for while using this medicine? Tell your doctor if your symptoms do not get better or if they get worse. Visit your doctor or health care professional for regular checks on your progress. Because it may take several weeks to see the full effects of this medicine, it is important to continue your treatment as prescribed by your doctor. Patients and their families should watch out for  new or worsening thoughts of suicide or depression. Also watch out for   sudden changes in feelings such as feeling anxious, agitated, panicky, irritable, hostile, aggressive, impulsive, severely restless, overly excited and hyperactive, or not being able to sleep. If this happens, especially at the beginning of treatment or after a change in dose, call your health care professional. Bonita QuinYou may get drowsy or dizzy. Do not drive, use machinery, or do anything that needs mental alertness until you know how this medicine affects you. Do not stand or sit up quickly, especially if you are an older patient. This reduces the risk of dizzy or fainting spells. Alcohol may interfere with the effect of this medicine. Avoid alcoholic drinks. Your mouth may get dry. Chewing sugarless gum or sucking hard candy, and drinking plenty of water may help. Contact your doctor if the problem does not go away or is severe. What side effects may I notice from receiving this medicine? Side effects that you should report to your doctor or health care professional as soon as possible: -allergic reactions like skin rash, itching or hives, swelling of the face, lips, or tongue -anxious -black, tarry stools -changes in vision -confusion -elevated mood, decreased need for sleep, racing thoughts, impulsive behavior -eye pain -fast, irregular heartbeat -feeling faint or lightheaded, falls -feeling agitated, angry, or irritable -hallucination, loss of contact with reality -loss of balance or coordination -loss of memory -painful or prolonged erections -restlessness, pacing, inability to keep still -seizures -stiff muscles -suicidal thoughts or other mood changes -trouble sleeping -unusual bleeding or bruising -unusually weak or tired -vomiting Side effects that usually do not require medical attention (report to your doctor or health care professional if they continue or are bothersome): -changes in appetite -change in sex  drive or performance -headache -increased sweating -indigestion, nausea -tremors This list may not describe all possible side effects. Call your doctor for medical advice about side effects. You may report side effects to FDA at 1-800-FDA-1088. Where should I keep my medicine? Keep out of reach of children. Store at room temperature between 15 and 30 degrees C (59 and 86 degrees F). Throw away any unused medicine after the expiration date. NOTE: This sheet is a summary. It may not cover all possible information. If you have questions about this medicine, talk to your doctor, pharmacist, or health care provider.  2018 Elsevier/Gold Standard (2015-10-31 13:20:23)     IF you received an x-ray today, you will receive an invoice from [facility]. Please contact [facility] at [phone] with questions or concerns regarding your invoice.   IF you received labwork today, you will receive an invoice from [facility]. Please contact [facility] at [phone] with questions or concerns regarding your invoice.   Our billing staff will not be able to assist you with questions regarding bills from these companies.  You will be contacted with the lab results as soon as they are available. The fastest way to get your results is to activate your My Chart account. Instructions are located on the last page of this paperwork. If you have not heard from us regarding the results in 2 weeks, please contact this office.

## 2016-11-29 LAB — TSH: TSH: 0.992 u[IU]/mL (ref 0.450–4.500)

## 2016-11-30 ENCOUNTER — Telehealth: Payer: Self-pay | Admitting: Physician Assistant

## 2016-11-30 NOTE — Telephone Encounter (Signed)
Patient needs Disability forms completed by Trena PlattStephanie English for her Anxiety. I have completed what I could from the OV notes and highlighted the areas that need to be finished. I will place the forms in your box on 11/30/16 please return them to the FMLA/Disability box at the 102 checkout desk within 5-7 business days. Thank you!

## 2016-12-01 NOTE — Telephone Encounter (Signed)
Form completed for patient pick up.   I discussed with patient that she must come here directly after any visit with ED --within a week from incapacity, or if she finds her health to hold her from work, and requires any kind of disability paperwork.  She voiced understanding. (background sounded as if she was driving). -therapy visit said to be on Thursday 12/06/2016 Will cover 5/30-6/28/2018 but may return to work sooner.

## 2016-12-04 ENCOUNTER — Ambulatory Visit: Payer: 59 | Admitting: Urgent Care

## 2016-12-04 NOTE — Telephone Encounter (Signed)
Paperwork found, scanned and faxed to employer on 12/04/16

## 2016-12-04 NOTE — Telephone Encounter (Signed)
Where was this paperwork placed because I did not get a copy of it and there is no record of the completed forms anywhere. Has the patient already gotten them? Did you call her to let her know? Please let me know about these forms as soon as possible thank you!

## 2016-12-06 ENCOUNTER — Encounter: Payer: Self-pay | Admitting: Urgent Care

## 2016-12-06 ENCOUNTER — Ambulatory Visit (INDEPENDENT_AMBULATORY_CARE_PROVIDER_SITE_OTHER): Payer: 59 | Admitting: Urgent Care

## 2016-12-06 VITALS — BP 100/67 | HR 99 | Temp 98.2°F | Resp 16 | Ht 65.0 in | Wt 108.0 lb

## 2016-12-06 DIAGNOSIS — F410 Panic disorder [episodic paroxysmal anxiety] without agoraphobia: Secondary | ICD-10-CM | POA: Diagnosis not present

## 2016-12-06 DIAGNOSIS — F419 Anxiety disorder, unspecified: Secondary | ICD-10-CM

## 2016-12-06 DIAGNOSIS — F329 Major depressive disorder, single episode, unspecified: Secondary | ICD-10-CM

## 2016-12-06 MED ORDER — CLONAZEPAM 0.5 MG PO TABS: 0.2500 mg | ORAL_TABLET | Freq: Two times a day (BID) | ORAL | 0 refills | Status: DC | PRN

## 2016-12-06 NOTE — Progress Notes (Signed)
    MRN: [id number] DOB: 1985/06/29  Subjective:   Sherol DadeLauren M Valentine is a 31 y.o. female presenting for follow up on GAD. Patient was last seen on 11/28/2016 by PA-English. Patient was started on citalopram 20mg  and clonazepam 0.5mg  at that office visit. Today, patient reports that she has started taking 20mg  citalopram today up from 10mg  which is what she was instructed to start on. She has been using clonazepam 2-3 times daily. Admits that it is helping more. Has been having anxiety attacks daily. These are triggered by "any little thing" that most people would not even bother with. Has episodes of feeling chest pain, shob, heart racing, irritability, nausea. Clonazepam has helped her to manage these better. However, she has significant stressors in her life. She has to support 2 of her children, ages 836 and 7210, and 2 of her younger sisters, ages 3716 and 7818. Her mother is not supportive and in fact moved out 05/2016 and this was very good for the patient. However, she has since had a cousin who passed away in May and today found out that her ex-boyfriend also passed away. She has had a difficult time coping. Was planning on going to a therapist today but was unable to due to the bad news she learned of today. She admits that she has crying spells, has difficulty with her sleep, decreased appetite but tries to force herself to eat. Denies HI, SI. Does not have firearms in the home. 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 But she is requesting FMLA which PA-English had informed patient that she completed this already.  Antha has a current medication list which includes the following prescription(s): citalopram, clonazepam, hydroxyzine, hydroxyzine, and levonorgestrel. Also has No Known Allergies. Deshauna  has a past medical history of Anxiety. Also denies past surgical history.  Objective:   Vitals: BP 100/67   Pulse 99   Temp 98.2 F (36.8 C) (Oral)   Resp 16   Ht 5\' 5"  (1.651 m)   Wt 108  lb (49 kg)   SpO2 100%   BMI 17.97 kg/m   Physical Exam  Constitutional: She is oriented to person, place, and time. She appears well-developed and well-nourished.  Cardiovascular: Normal rate.   Pulmonary/Chest: Effort normal.  Neurological: She is alert and oriented to person, place, and time.  Psychiatric: Her mood appears anxious. She exhibits a depressed mood (flat affect and tearful at times during visit). She expresses no homicidal and no suicidal ideation.    Assessment and Plan :   1. Anxiety and depression 2. Panic attacks - Counseled on use of clonazepam and need for behavioral therapy. I also emphasized need to continue use of citalopram as a long term maintenance therapy. Counseled patient on potential for adverse effects with medications prescribed today, patient verbalized understanding. Patient will get in touch with a therapist soon. Follow up with PA-English in 1 week.  Wallis BambergMario Eloyse Causey, PA-C [facility] [phone] 12/06/2016 4:49 PM

## 2016-12-06 NOTE — Patient Instructions (Addendum)
[facility] [address] [place], [place] [address]  Donna Lowery [phone]  Donna Lowery [phone]  Donna Lowery [phone]   [facility] ([address] Donna Lowery) - [phone]  Donna Lowery [name]) - [phone]  [facility] - [phone]  [facility] - [phone]  Donna Lowery - [phone]  [facility]  - [phone] (do not file insurance)     Citalopram tablets What is this medicine? CITALOPRAM (sye TAL oh pram) is a medicine for depression. This medicine may be used for other purposes; ask your health care provider or pharmacist if you have questions. COMMON BRAND NAME(S): Celexa What should I tell my health care provider before I take this medicine? They need to know if you have any of these conditions: -bleeding disorders -bipolar disorder or a family history of bipolar disorder -glaucoma -heart disease -history of irregular heartbeat -kidney disease -liver disease -low levels of magnesium or potassium in the blood -receiving electroconvulsive therapy -seizures -suicidal thoughts, plans, or attempt; a previous suicide attempt by you or a family member -take medicines that treat or prevent blood clots -thyroid disease -an unusual or allergic reaction to citalopram, escitalopram, other medicines, foods, dyes, or preservatives -pregnant or trying to become pregnant -breast-feeding How should I use this medicine? Take this medicine by mouth with a glass of water. Follow the directions on the prescription label. You can take it with or without food. Take your medicine at regular intervals. Do not take your medicine more often than directed. Do not stop taking this medicine suddenly except upon the advice of your doctor. Stopping this medicine too quickly may cause serious side  effects or your condition may worsen. A special MedGuide will be given to you by the pharmacist with each prescription and refill. Be sure to read this information carefully each time. Talk to your pediatrician regarding the use of this medicine in children. Special care may be needed. Patients over 18 years old may have a stronger reaction and need a smaller dose. Overdosage: If you think you have taken too much of this medicine contact a poison control center or emergency room at once. NOTE: This medicine is only for you. Do not share this medicine with others. What if I miss a dose? If you miss a dose, take it as soon as you can. If it is almost time for your next dose, take only that dose. Do not take double or extra doses. What may interact with this medicine? Do not take this medicine with any of the following medications: -certain medicines for fungal infections like fluconazole, itraconazole, ketoconazole, posaconazole, voriconazole -cisapride -dofetilide -dronedarone -escitalopram -linezolid -MAOIs like Carbex, Eldepryl, Marplan, Nardil, and Parnate -methylene blue (injected into a vein) -pimozide -thioridazine -ziprasidone This medicine may also interact with the following medications: -alcohol -amphetamines -aspirin and aspirin-like medicines -carbamazepine -certain medicines for depression, anxiety, or psychotic disturbances -certain medicines for infections like chloroquine, clarithromycin, erythromycin, furazolidone, isoniazid, pentamidine -certain medicines for migraine headaches like almotriptan, eletriptan, frovatriptan, naratriptan, rizatriptan, sumatriptan, zolmitriptan -certain medicines for sleep -certain medicines that treat or prevent blood clots like dalteparin, enoxaparin, warfarin -cimetidine -diuretics -fentanyl -lithium -methadone -metoprolol -NSAIDs, medicines for pain and inflammation, like ibuprofen or naproxen -omeprazole -other medicines that  prolong the QT interval (cause an abnormal heart rhythm) -procarbazine -rasagiline -supplements like St. John's wort, kava kava, valerian -tramadol -tryptophan This list may not describe all possible interactions. Give your health care provider  a list of all the medicines, herbs, non-prescription drugs, or dietary supplements you use. Also tell them if you smoke, drink alcohol, or use illegal drugs. Some items may interact with your medicine. What should I watch for while using this medicine? Tell your doctor if your symptoms do not get better or if they get worse. Visit your doctor or health care professional for regular checks on your progress. Because it may take several weeks to see the full effects of this medicine, it is important to continue your treatment as prescribed by your doctor. Patients and their families should watch out for new or worsening thoughts of suicide or depression. Also watch out for sudden changes in feelings such as feeling anxious, agitated, panicky, irritable, hostile, aggressive, impulsive, severely restless, overly excited and hyperactive, or not being able to sleep. If this happens, especially at the beginning of treatment or after a change in dose, call your health care professional. Bonita QuinYou may get drowsy or dizzy. Do not drive, use machinery, or do anything that needs mental alertness until you know how this medicine affects you. Do not stand or sit up quickly, especially if you are an older patient. This reduces the risk of dizzy or fainting spells. Alcohol may interfere with the effect of this medicine. Avoid alcoholic drinks. Your mouth may get dry. Chewing sugarless gum or sucking hard candy, and drinking plenty of water will help. Contact your doctor if the problem does not go away or is severe. What side effects may I notice from receiving this medicine? Side effects that you should report to your doctor or health care professional as soon as possible: -allergic  reactions like skin rash, itching or hives, swelling of the face, lips, or tongue -anxious -black, tarry stools -breathing problems -changes in vision -chest pain -confusion -elevated mood, decreased need for sleep, racing thoughts, impulsive behavior -eye pain -fast, irregular heartbeat -feeling faint or lightheaded, falls -feeling agitated, angry, or irritable -hallucination, loss of contact with reality -loss of balance or coordination -loss of memory -painful or prolonged erections -restlessness, pacing, inability to keep still -seizures -stiff muscles -suicidal thoughts or other mood changes -trouble sleeping -unusual bleeding or bruising -unusually weak or tired -vomiting Side effects that usually do not require medical attention (report to your doctor or health care professional if they continue or are bothersome): -change in appetite or weight -change in sex drive or performance -dizziness -headache -increased sweating -indigestion, nausea -tremors This list may not describe all possible side effects. Call your doctor for medical advice about side effects. You may report side effects to FDA at 1-800-FDA-1088. Where should I keep my medicine? Keep out of reach of children. Store at room temperature between 15 and 30 degrees C (59 and 86 degrees F). Throw away any unused medicine after the expiration date. NOTE: This sheet is a summary. It may not cover all possible information. If you have questions about this medicine, talk to your doctor, pharmacist, or health care provider.  2018 Elsevier/Gold Standard (2015-10-31 13:18:52)     Clonazepam tablets What is this medicine? CLONAZEPAM (kloe NA ze pam) is a benzodiazepine. It is used to treat certain types of seizures. It is also used to treat panic disorder. This medicine may be used for other purposes; ask your health care provider or pharmacist if you have questions. COMMON BRAND NAME(S): Ceberclon,  Klonopin What should I tell my health care provider before I take this medicine? They need to know if you have any of  these conditions: -an alcohol or drug abuse problem -bipolar disorder, depression, psychosis or other mental health condition -glaucoma -kidney or liver disease -lung or breathing disease -myasthenia gravis -Parkinson's disease -porphyria -seizures or a history of seizures -suicidal thoughts -an unusual or allergic reaction to clonazepam, other benzodiazepines, foods, dyes, or preservatives -pregnant or trying to get pregnant -breast-feeding How should I use this medicine? Take this medicine by mouth with a glass of water. Follow the directions on the prescription label. If it upsets your stomach, take it with food or milk. Take your medicine at regular intervals. Do not take it more often than directed. Do not stop taking or change the dose except on the advice of your doctor or health care professional. A special MedGuide will be given to you by the pharmacist with each prescription and refill. Be sure to read this information carefully each time. Talk to your pediatrician regarding the use of this medicine in children. Special care may be needed. Overdosage: If you think you have taken too much of this medicine contact a poison control center or emergency room at once. NOTE: This medicine is only for you. Do not share this medicine with others. What if I miss a dose? If you miss a dose, take it as soon as you can. If it is almost time for your next dose, take only that dose. Do not take double or extra doses. What may interact with this medicine? Do not take this medication with any of the following medicines: -narcotic medicines for cough -sodium oxybate This medicine may also interact with the following medications: -alcohol -antihistamines for allergy, cough and cold -antiviral medicines for HIV or AIDS -certain medicines for anxiety or sleep -certain medicines  for depression, like amitriptyline, fluoxetine, sertraline -certain medicines for fungal infections like ketoconazole and itraconazole -certain medicines for seizures like carbamazepine, phenobarbital, phenytoin, primidone -general anesthetics like halothane, isoflurane, methoxyflurane, propofol -local anesthetics like lidocaine, pramoxine, tetracaine -medicines that relax muscles for surgery -narcotic medicines for pain -phenothiazines like chlorpromazine, mesoridazine, prochlorperazine, thioridazine This list may not describe all possible interactions. Give your health care provider a list of all the medicines, herbs, non-prescription drugs, or dietary supplements you use. Also tell them if you smoke, drink alcohol, or use illegal drugs. Some items may interact with your medicine. What should I watch for while using this medicine? Tell your doctor or health care professional if your symptoms do not start to get better or if they get worse. Do not stop taking except on your doctor's advice. You may develop a severe reaction. Your doctor will tell you how much medicine to take. You may get drowsy or dizzy. Do not drive, use machinery, or do anything that needs mental alertness until you know how this medicine affects you. To reduce the risk of dizzy and fainting spells, do not stand or sit up quickly, especially if you are an older patient. Alcohol may increase dizziness and drowsiness. Avoid alcoholic drinks. If you are taking another medicine that also causes drowsiness, you may have more side effects. Give your health care provider a list of all medicines you use. Your doctor will tell you how much medicine to take. Do not take more medicine than directed. Call emergency for help if you have problems breathing or unusual sleepiness. The use of this medicine may increase the chance of suicidal thoughts or actions. Pay special attention to how you are responding while on this medicine. Any worsening  of mood, or thoughts of suicide  or dying should be reported to your health care professional right away. What side effects may I notice from receiving this medicine? Side effects that you should report to your doctor or health care professional as soon as possible: -allergic reactions like skin rash, itching or hives, swelling of the face, lips, or tongue -breathing problems -confusion -loss of balance or coordination -signs and symptoms of low blood pressure like dizziness; feeling faint or lightheaded, falls; unusually weak or tired -suicidal thoughts or mood changes Side effects that usually do not require medical attention (report to your doctor or health care professional if they continue or are bothersome): -dizziness -headache -tiredness -upset stomach This list may not describe all possible side effects. Call your doctor for medical advice about side effects. You may report side effects to FDA at 1-800-FDA-1088. Where should I keep my medicine? Keep out of the reach of children. This medicine can be abused. Keep your medicine in a safe place to protect it from theft. Do not share this medicine with anyone. Selling or giving away this medicine is dangerous and against the law. This medicine may cause accidental overdose and death if taken by other adults, children, or pets. Mix any unused medicine with a substance like cat litter or coffee grounds. Then throw the medicine away in a sealed container like a sealed bag or a coffee can with a lid. Do not use the medicine after the expiration date. Store at room temperature between 15 and 30 degrees C (59 and 86 degrees F). Protect from light. Keep container tightly closed. NOTE: This sheet is a summary. It may not cover all possible information. If you have questions about this medicine, talk to your doctor, pharmacist, or health care provider.  2018 Elsevier/Gold Standard (2015-11-04 18:46:32)   IF you received an x-ray today, you will  receive an invoice from [facility]. Please contact [facility] at [phone] with questions or concerns regarding your invoice.   IF you received labwork today, you will receive an invoice from [facility]. Please contact [facility] at [phone] with questions or concerns regarding your invoice.   Our billing staff will not be able to assist you with questions regarding bills from these companies.  You will be contacted with the lab results as soon as they are available. The fastest way to get your results is to activate your My Chart account. Instructions are located on the last page of this paperwork. If you have not heard from [place] regarding the results in 2 weeks, please contact this office.

## 2016-12-13 ENCOUNTER — Telehealth: Payer: Self-pay | Admitting: Physician Assistant

## 2016-12-13 ENCOUNTER — Ambulatory Visit: Payer: 59 | Admitting: Physician Assistant

## 2016-12-13 NOTE — Telephone Encounter (Signed)
Patients employer needs a forms signed by Judeth CornfieldStephanie about this patients days out of work. I have completed the forms based off the OV notes and the other disability forms that were completed and they just need a signature. I will place the forms in your box on 12/13/16 please return them to the FMLA/Disability box at the [address] checkout desk within 5-7 business days. Thank you!

## 2016-12-17 ENCOUNTER — Ambulatory Visit (INDEPENDENT_AMBULATORY_CARE_PROVIDER_SITE_OTHER): Payer: 59 | Admitting: Family Medicine

## 2016-12-17 ENCOUNTER — Emergency Department (HOSPITAL_COMMUNITY): Admission: EM | Admit: 2016-12-17 | Discharge: 2016-12-17 | Discharge status: Against Medical Advice | Payer: 59

## 2016-12-17 ENCOUNTER — Encounter: Payer: Self-pay | Admitting: Family Medicine

## 2016-12-17 VITALS — BP 101/70 | HR 104 | Temp 98.3°F | Resp 18 | Ht 65.0 in | Wt 106.8 lb

## 2016-12-17 DIAGNOSIS — Z0271 Encounter for disability determination: Secondary | ICD-10-CM

## 2016-12-17 DIAGNOSIS — N898 Other specified noninflammatory disorders of vagina: Secondary | ICD-10-CM | POA: Diagnosis not present

## 2016-12-17 DIAGNOSIS — R109 Unspecified abdominal pain: Secondary | ICD-10-CM | POA: Diagnosis not present

## 2016-12-17 DIAGNOSIS — Z113 Encounter for screening for infections with a predominantly sexual mode of transmission: Secondary | ICD-10-CM

## 2016-12-17 DIAGNOSIS — R590 Localized enlarged lymph nodes: Secondary | ICD-10-CM | POA: Diagnosis not present

## 2016-12-17 DIAGNOSIS — N760 Acute vaginitis: Secondary | ICD-10-CM

## 2016-12-17 DIAGNOSIS — T192XXA Foreign body in vulva and vagina, initial encounter: Secondary | ICD-10-CM | POA: Diagnosis not present

## 2016-12-17 LAB — POCT UA - MICROSCOPIC ONLY
CASTS, UR, LPF, POC: ABSENT
CRYSTALS, UR, HPF, POC: ABSENT
Mucus, UA: ABSENT
YEAST UA: ABSENT

## 2016-12-17 LAB — POCT URINALYSIS DIPSTICK
GLUCOSE UA: NEGATIVE
Nitrite, UA: POSITIVE
Protein, UA: >=300
UROBILINOGEN UA: 1 U/dL
pH, UA: 5.5 (ref 5.0–8.0)

## 2016-12-17 LAB — POCT WET + KOH PREP
TRICH BY WET PREP: ABSENT
YEAST BY KOH: ABSENT
Yeast by wet prep: ABSENT

## 2016-12-17 NOTE — Patient Instructions (Addendum)
Based on the location of your pain and symptoms, I am suspicious for a possible early pelvic inflammatory infection or vaginal infection. We did check some of the sexually-transmitted causes of that infection, but for now would like to start you on antibiotics. You received an antibiotic injection in the office but will also need to pick up prescriptions for 2 other pills. As we discussed both of those medicines can sometimes cause stomach upset, and avoid any alcohol while taking the metronidazole. Recheck in the next 3-4 days, sooner if worse.   IF you received an x-ray today, you will receive an invoice from [facility]. Please contact [facility] at [phone] with questions or concerns regarding your invoice.   IF you received labwork today, you will receive an invoice from [facility]. Please contact [facility] at [phone] with questions or concerns regarding your invoice.   Our billing staff will not be able to assist you with questions regarding bills from these companies.  You will be contacted with the lab results as soon as they are available. The fastest way to get your results is to activate your My Chart account. Instructions are located on the last page of this paperwork. If you have not heard from us regarding the results in 2 weeks, please contact this office.

## 2016-12-17 NOTE — ED Notes (Signed)
Pt called in waiting room multiple times, no response

## 2016-12-17 NOTE — ED Notes (Signed)
No answer x1

## 2016-12-17 NOTE — Progress Notes (Addendum)
Subjective:  By signing my name below, I, Donna Lowery, attest that this documentation has been prepared under the direction and in the presence of Shade FloodJeffrey R Jonice Cerra, MD Electronically Signed: Charline BillsEssence Lowery, ED Scribe 12/17/2016 at 3:16 PM.   Patient ID: Donna DadeLauren M Lowery, female    DOB: Nov 03, 1985, 31 y.o.   MRN: [id number]  Chief Complaint  Patient presents with  . Illness    x24 hours, chills, pt reports fever but didn't check temp last night, loss appetite; per patient, no sore throat or difficulty swallowing.  . Depression    Depression scale score 19  . Abdominal Pain    lower abdomen   HPI Donna Lowery is a 31 y.o. female who [facility] complaining of subjective fever onset yesterday evening. Triage temperature: 98.3 F. Pt reports associated symptoms of chills, decreased appetite and sharp left lower quadrant pain. Pt does recall falling 2 days ago while skating but states she did not noticed pain until last night. She denies sore throat, cough, congestion, rhinorrhea, rash, vomiting, diarrhea, difficultly urinating, urinary frequency, dysuria, vaginal discharge, new sexual partners. No h/o STI. Pt has Mirena IUD and reports occasional spotting. She also denies new medications, recent travels, camping, tick exposure.  Depression  [facility] 12/17/2016 12/06/2016 11/28/2016  Decreased Interest 3 3 3   Down, Depressed, Hopeless 2 3 3   PHQ - 2 Score 5 6 6   Altered sleeping 3 3 3   Tired, decreased energy 3 3 3   Change in appetite 3 3 3   Feeling bad or failure about yourself  1 1 1   Trouble concentrating 3 3 2   Moving slowly or fidgety/restless 1 1 1   Suicidal thoughts 0 0 0  PHQ-9 Score 19 20 19   Difficult doing work/chores Very difficult Extremely dIfficult Extremely dIfficult   No SI/HI. No changes in depression recently. Has not missed any doses of Selexa.   There are no active problems to display for this patient.  Past Medical  History:  Diagnosis Date  . Anxiety    No past surgical history on file. No Known Allergies Prior to Admission medications   Medication Sig Start Date End Date Taking? Authorizing Provider  citalopram (CELEXA) 20 MG tablet Take 1 tablet (20 mg total) by mouth daily. Start with half tablet daily for 1 week, then increase to full tablet daily. 11/28/16   Trena PlattEnglish, Stephanie D, PA  clonazePAM (KLONOPIN) 0.5 MG tablet Take 0.5 tablets (0.25 mg total) by mouth 2 (two) times daily as needed for anxiety. 12/06/16   Wallis BambergMani, Mario, PA-C  hydrOXYzine (ATARAX/VISTARIL) 25 MG tablet Take 0.5-1 tablets po every 8 hours prn anxiety 09/09/15   Dorna LeitzBush, Nicole V, PA-C  hydrOXYzine (ATARAX/VISTARIL) 25 MG tablet Take 1 tablet (25 mg total) by mouth every 8 (eight) hours as needed for anxiety. 11/12/16   Joy, Shawn C, PA-C  levonorgestrel (MIRENA) 20 MCG/24HR IUD 1 each by Intrauterine route once. Ongoing    [provider]   Social History   Social History  . Marital status: Single    Spouse name: N/A  . Number of children: N/A  . Years of education: N/A   Occupational History  . Not on file.   Social History Main Topics  . Smoking status: Current Every Day Smoker    Packs/day: 1.00    Years: 10.00  . Smokeless tobacco: Never Used  . Alcohol use Yes     Comment: occasional  . Drug use: No  .  Sexual activity: Not on file   Other Topics Concern  . Not on file   Social History Narrative  . No narrative on file   Review of Systems  Constitutional: Positive for appetite change, chills and fever (subjective).  HENT: Negative for congestion, rhinorrhea and sore throat.   Respiratory: Negative for cough.   Gastrointestinal: Positive for abdominal pain. Negative for diarrhea, nausea and vomiting.  Genitourinary: Positive for vaginal bleeding (spotting). Negative for difficulty urinating, dysuria, frequency and vaginal discharge.  Skin: Negative for rash.      Objective:   Physical Exam    Cardiovascular: Normal rate and regular rhythm.  Exam reveals no gallop and no friction rub.   No murmur heard. Pulmonary/Chest: Effort normal and breath sounds normal.  Abdominal: There is no CVA tenderness.  Genitourinary:  Genitourinary Comments: Chaperone present. Moderate amount of mucopurulent discharge in vaginal vault. No external rash or bleeding. Suspected piece of latex condom was removed with fox swab. No other apparent foreign body. Minimal CMT. No apparent adnexal tenderness. GC/CT swab obtained.  Lymphadenopathy:  Chaperone present. Tender over the L inguinal fold. Slight discomfort with resisted hip flexion. Lymphadenopathy on the L inguinal fold.   Vitals:   12/17/16 1431  BP: 101/70  Pulse: (!) 104  Resp: 18  Temp: 98.3 F (36.8 C)  TempSrc: Oral  SpO2: 99%  Weight: 106 lb 12.8 oz (48.4 kg)  Height: 5\' 5"  (1.651 m)   Results for orders placed or performed in visit on 12/17/16  POCT UA - Microscopic Only  Result Value Ref Range   WBC, Ur, HPF, POC moderate    RBC, urine, microscopic Too many to count    Bacteria, U Microscopic many    Mucus, UA absent    Epithelial cells, urine per micros few    Crystals, Ur, HPF, POC absent    Casts, Ur, LPF, POC absent    Yeast, UA absent   POCT urinalysis dipstick  Result Value Ref Range   Color, UA brown    Clarity, UA cloudy    Glucose, UA negative    Bilirubin, UA moderate    Ketones, UA trace    Spec Grav, UA >=1.030 (A) 1.010 - 1.025   Blood, UA Large    pH, UA 5.5 5.0 - 8.0   Protein, UA >=300    Urobilinogen, UA 1.0 0.2 or 1.0 E.U./dL   Nitrite, UA positive    Leukocytes, UA Trace (A) Negative  POCT Wet + KOH Prep  Result Value Ref Range   Yeast by KOH Absent Absent   Yeast by wet prep Absent Absent   WBC by wet prep Few Few   Clue Cells Wet Prep HPF POC Few (A) None   Trich by wet prep Absent Absent   Bacteria Wet Prep HPF POC Many (A) Few   Epithelial Cells By [employer] (UMFC) Few None, Few, Too  numerous to count   RBC,UR,HPF,POC None None RBC/hpf      Assessment & Plan:  Donna Lowery is a 31 y.o. female Abdominal pain, unspecified abdominal location - Plan: POCT UA - Microscopic Only, POCT urinalysis dipstick, POCT CBC, HIV antibody, RPR, GC/Chlamydia Probe Amp  Vaginal discharge - Plan: POCT Wet + KOH Prep  Vaginal foreign body, initial encounter  Inguinal lymphadenopathy - Plan: POCT CBC  Routine screening for STI (sexually transmitted infection) - Plan: POCT Wet + KOH Prep, HIV antibody, RPR, GC/Chlamydia Probe Amp  Approximately 1 day history of subjective chills,  decreased appetite, lower abdominal pain but primarily left inguinal pain. Left inguinal lymphadenopathy was noted, mucopurulent discharge with retained foreign body, suspected piece of latex condom. This was removed as above. Possible mild CMT, but no adnexal tenderness, no fever. Still appears to be infection and possible early PID. Plan for CBC, HIV, RPR along with other STI testing already drawn above, but patient had to leave emergently from office prior to conclusion of visit due to a family emergency. I called her and asked her to come back later in the afternoon, but will not be able to return until this morning.  -Plan for HIV/RPR, CBC on blood work this morning, then Rocephin 250 mg IM, doxycycline 100 mg twice a day, and Flagyl as wet prep did indicate a few clue cells. Potential side effects of medications discussed as well as avoidance of alcohol.  2 week course of doxycycline and Flagyl were prescribed, but if she is having side effects, potentially could lessen course to 7-10 days as milder symptoms at present.  - check urine culture  -Recheck in 48-72 hours, sooner if worse.  10:18 AM Called patient, she had another obligation first thing this morning. She did state she is on her way to our office now. Overall symptoms are stable but had night sweats last night.  Plan for CBC in office, then likely  Rocephin injection and plan as above.  11:32 AM Lab Results  Component Value Date   WBC 8.6 12/18/2016   HGB 13.8 12/18/2016   HCT 40.5 12/18/2016   MCV 91.0 12/18/2016   PLT 203 07/30/2012   CBC noted. Will give Rocephin as above and start on doxycycline and Flagyl. Plan for follow-up in next few days, sooner if worse. STI testing pending   No orders of the defined types were placed in this encounter.  Patient Instructions      IF you received an x-ray today, you will receive an invoice from [facility]. Please contact [facility] at [phone] with questions or concerns regarding your invoice.   IF you received labwork today, you will receive an invoice from [facility]. Please contact [facility] at [phone] with questions or concerns regarding your invoice.   Our billing staff will not be able to assist you with questions regarding Lowery from these companies.  You will be contacted with the lab results as soon as they are available. The fastest way to get your results is to activate your My Chart account. Instructions are located on the last page of this paperwork. If you have not heard from [place] regarding the results in 2 weeks, please contact this office.      I personally performed the services described in this documentation, which was scribed in my presence. The recorded information has been reviewed and considered for accuracy and completeness, addended by me as needed, and agree with information above.  Signed,   Meredith Staggers, MD Primary Care at [facility].  12/18/16 9:09 AM

## 2016-12-18 ENCOUNTER — Ambulatory Visit (INDEPENDENT_AMBULATORY_CARE_PROVIDER_SITE_OTHER): Payer: 59 | Admitting: Emergency Medicine

## 2016-12-18 ENCOUNTER — Encounter: Payer: Self-pay | Admitting: Physician Assistant

## 2016-12-18 ENCOUNTER — Ambulatory Visit (INDEPENDENT_AMBULATORY_CARE_PROVIDER_SITE_OTHER): Payer: 59 | Admitting: Physician Assistant

## 2016-12-18 VITALS — BP 101/68 | HR 98 | Temp 98.1°F | Resp 16 | Ht 66.34 in | Wt 106.0 lb

## 2016-12-18 DIAGNOSIS — F410 Panic disorder [episodic paroxysmal anxiety] without agoraphobia: Secondary | ICD-10-CM

## 2016-12-18 DIAGNOSIS — N760 Acute vaginitis: Secondary | ICD-10-CM | POA: Diagnosis not present

## 2016-12-18 DIAGNOSIS — R590 Localized enlarged lymph nodes: Secondary | ICD-10-CM | POA: Diagnosis not present

## 2016-12-18 DIAGNOSIS — F430 Acute stress reaction: Secondary | ICD-10-CM | POA: Diagnosis not present

## 2016-12-18 DIAGNOSIS — A549 Gonococcal infection, unspecified: Secondary | ICD-10-CM

## 2016-12-18 DIAGNOSIS — F329 Major depressive disorder, single episode, unspecified: Secondary | ICD-10-CM | POA: Diagnosis not present

## 2016-12-18 DIAGNOSIS — F419 Anxiety disorder, unspecified: Secondary | ICD-10-CM

## 2016-12-18 LAB — POCT CBC
Granulocyte percent: 63.8 %G (ref 37–80)
HEMATOCRIT: 40.5 % (ref 37.7–47.9)
HEMOGLOBIN: 13.8 g/dL (ref 12.2–16.2)
LYMPH, POC: 2.6 (ref 0.6–3.4)
MCH, POC: 31 pg (ref 27–31.2)
MCHC: 34 g/dL (ref 31.8–35.4)
MCV: 91 fL (ref 80–97)
MID (cbc): 0.5 (ref 0–0.9)
MPV: 8.3 fL (ref 0–99.8)
POC GRANULOCYTE: 5.5 (ref 2–6.9)
POC LYMPH PERCENT: 30.5 %L (ref 10–50)
POC MID %: 5.7 % (ref 0–12)
Platelet Count, POC: 210 10*3/uL (ref 142–424)
RBC: 4.45 M/uL (ref 4.04–5.48)
RDW, POC: 12.9 %
WBC: 8.6 10*3/uL (ref 4.6–10.2)

## 2016-12-18 MED ORDER — DOXYCYCLINE HYCLATE 100 MG PO TABS: 100.0000 mg | ORAL_TABLET | Freq: Two times a day (BID) | ORAL | 0 refills | Status: DC

## 2016-12-18 MED ORDER — CEFTRIAXONE SODIUM 250 MG IJ SOLR
250.0000 mg | Freq: Once | INTRAMUSCULAR | Status: AC
  Administered 2016-12-18: 250 mg via INTRAMUSCULAR

## 2016-12-18 MED ORDER — METRONIDAZOLE 500 MG PO TABS: ORAL_TABLET | ORAL | 0 refills | Status: DC

## 2016-12-18 NOTE — Telephone Encounter (Signed)
Filled out what I thought I could.  Sat this at [employer] desk.  We will extend her time off.  She will return a blank fmla form.

## 2016-12-18 NOTE — Patient Instructions (Addendum)
I would like you to take the full dose of the 20mg  celexa daily. Please follow up on 12/31/2016    IF you received an x-ray today, you will receive an invoice from [facility]. Please contact [facility] at [phone] with questions or concerns regarding your invoice.   IF you received labwork today, you will receive an invoice from [facility]. Please contact [facility] at [phone] with questions or concerns regarding your invoice.   Our billing staff will not be able to assist you with questions regarding bills from these companies.  You will be contacted with the lab results as soon as they are available. The fastest way to get your results is to activate your My Chart account. Instructions are located on the last page of this paperwork. If you have not heard from us regarding the results in 2 weeks, please contact this office.

## 2016-12-19 LAB — HIV ANTIBODY (ROUTINE TESTING W REFLEX): HIV Screen 4th Generation wRfx: NONREACTIVE

## 2016-12-19 LAB — RPR: RPR Ser Ql: NONREACTIVE

## 2016-12-19 NOTE — Telephone Encounter (Signed)
Paperwork scanned and faxed to employer on 12/19/16.

## 2016-12-20 LAB — GC/CHLAMYDIA PROBE AMP
Chlamydia trachomatis, NAA: NEGATIVE
Neisseria gonorrhoeae by PCR: POSITIVE — AB

## 2016-12-21 NOTE — Progress Notes (Signed)
PRIMARY CARE AT [facility] [address], [place] [address] [phone]  Date:  12/18/2016   Name:  Donna Lowery   DOB:  10-13-1985   MRN:  [id number]  PCP:  Default, Provider, MD    History of Present Illness:  Donna Lowery is a 31 y.o. female patient who presents to PCP with  Chief Complaint  Patient presents with  . Depression    f/u     No difference in amount of anxiety attacks, though she states that this is helping.  She is taking the anxiety medicaiton almost daily.   No SI/HI She has been able to get out and socialize though she states this has been a struggle.  She divulges that she was able to go to [place] and have a good time. She has been cooking and grocery shopping.   Not sleeping well.  Trying benadryl.   She will follow up with therapist.  This was postponed.    There are no active problems to display for this patient.   Past Medical History:  Diagnosis Date  . Anxiety     History reviewed. No pertinent surgical history.  Social History  Substance Use Topics  . Smoking status: Current Every Day Smoker    Packs/day: 1.00    Years: 10.00  . Smokeless tobacco: Never Used  . Alcohol use Yes     Comment: occasional    History reviewed. No pertinent family history.  No Known Allergies  Medication list has been reviewed and updated.  Current Outpatient Prescriptions on File Prior to Visit  Medication Sig Dispense Refill  . citalopram (CELEXA) 20 MG tablet Take 1 tablet (20 mg total) by mouth daily. Start with half tablet daily for 1 week, then increase to full tablet daily. 30 tablet 1  . clonazePAM (KLONOPIN) 0.5 MG tablet Take 0.5 tablets (0.25 mg total) by mouth 2 (two) times daily as needed for anxiety. 20 tablet 0  . doxycycline (VIBRA-TABS) 100 MG tablet Take 1 tablet (100 mg total) by mouth 2 (two) times daily. 28 tablet 0  . hydrOXYzine (ATARAX/VISTARIL) 25 MG tablet Take 0.5-1 tablets po every 8 hours prn anxiety 30 tablet 0   . hydrOXYzine (ATARAX/VISTARIL) 25 MG tablet Take 1 tablet (25 mg total) by mouth every 8 (eight) hours as needed for anxiety. 20 tablet 0  . levonorgestrel (MIRENA) 20 MCG/24HR IUD 1 each by Intrauterine route once. Ongoing    . metroNIDAZOLE (FLAGYL) 500 MG tablet 1 pill by mouth twice per day.  Avoid any alcohol while taking this medicine. 28 tablet 0   No current facility-administered medications on file prior to visit.     ROS ROS otherwise unremarkable unless listed above.  Physical Examination: BP 101/68 (BP Location: Right Arm, Patient Position: Sitting, Cuff Size: Normal)   Pulse 98   Temp 98.1 F (36.7 C) (Oral)   Resp 16   Ht 5' 6.34" (1.685 m)   Wt 106 lb (48.1 kg)   SpO2 99%   BMI 16.93 kg/m  Ideal Body Weight: Weight in (lb) to have BMI = 25: 156.2  Physical Exam  Constitutional: She is oriented to person, place, and time. She appears well-developed and well-nourished. No distress.  HENT:  Head: Normocephalic and atraumatic.  Right Ear: External ear normal.  Left Ear: External ear normal.  Eyes: Pupils are equal, round, and reactive to light. Conjunctivae and EOM are normal.  Cardiovascular: Normal rate.   Pulmonary/Chest: Effort normal. No respiratory  distress.  Neurological: She is alert and oriented to person, place, and time.  Skin: She is not diaphoretic.  Psychiatric: She has a normal mood and affect. Her behavior is normal.     Assessment and Plan: Donna Lowery is a 31 y.o. female who is here today for cc of  Chief Complaint  Patient presents with  . Depression    f/u   Advised to return in 2 weeks, 12/31/2016.   Advised to increase to 20mg  celexa. Panic attack as reaction to stress  Anxiety and depression  Donna PlattStephanie Shawne Bulow, PA-C [facility] 9/14/20181:22 PM

## 2016-12-24 ENCOUNTER — Telehealth: Payer: Self-pay | Admitting: Physician Assistant

## 2016-12-24 NOTE — Telephone Encounter (Signed)
Patient sent over new FMLA forms to be completed by [place]. I have completed what I could from the OV notes and highlighted what needs to be finished. I will place the forms in your box on 12/24/16 please return them to the FMLA/Disability box at the 102 checkout desk within 5-7 business days. Thank you!

## 2016-12-27 ENCOUNTER — Telehealth: Payer: Self-pay | Admitting: Radiology

## 2016-12-27 NOTE — Telephone Encounter (Signed)
The patient just called, and stated that she does feel little bit better, but she still has vaginal itching, and gave her a lot of aggravation. Please advise. Thanks.

## 2016-12-27 NOTE — Progress Notes (Signed)
Here for Ceftriaxone injection only.

## 2016-12-27 NOTE — Telephone Encounter (Signed)
Vaginal itching could be due to yeast infection, as she has been on antibiotic, but also possible BV as clue cells present prior. As she had difficulty with Flagyl, would recommend repeat OV for wetprep to determine cause.

## 2016-12-28 NOTE — Telephone Encounter (Signed)
Spoke with pt and she stated that she got a message from Rodi this morning and advised her to come into the office. Attempted to transfer patient to make an apt but patient hung up after she was transferred.

## 2017-01-02 NOTE — Telephone Encounter (Signed)
Completed in box. Thank you.

## 2017-01-02 NOTE — Telephone Encounter (Signed)
Have we completed these forms? Today is the 7th business day, please let me know.  Thank you!

## 2017-01-03 ENCOUNTER — Telehealth: Payer: Self-pay | Admitting: Family Medicine

## 2017-01-03 NOTE — Telephone Encounter (Signed)
Pt would like for stephanie to give her a call regarding fmla SHE STATES THAT SHES HAVING ISSUES WOULDN'T SAY WHAT

## 2017-01-03 NOTE — Telephone Encounter (Signed)
[facility]work [facility] on 01/03/17 also sent to patient through MyChart

## 2017-01-07 ENCOUNTER — Telehealth: Payer: Self-pay | Admitting: Physician Assistant

## 2017-01-07 NOTE — Telephone Encounter (Signed)
mychart message sent to patient about coming in to see stephanie for completion of her FMLA forms.

## 2017-01-07 NOTE — Telephone Encounter (Signed)
Will [facility] blank records [facility] letting them know about the patient not following up and will call patient and see if she has returned to work as of 01/01/17.

## 2017-01-07 NOTE — Telephone Encounter (Signed)
Given to caitlin not completed.  Inquired about asking patient if she returned to work, because no other forms will be completed past her visit at this time.

## 2017-01-07 NOTE — Telephone Encounter (Signed)
I will call patient and see what is wrong with her FMLA.

## 2017-01-07 NOTE — Telephone Encounter (Signed)
--  Called patient and LVM for her to call me back about the problem she is having with her FMLA/Employer.  --[facility] also sent over more paperwork for her needing to know her release date, I was not sure how to complete these forms because the patient was suppose to come back in on 12/31/16 for an OV but there was never one made and patient has not been seen here since 12/18/16 with no follow up I dont see how we are going to continue her paperwork.  I will place the forms in Stephanie's box on 01/07/17 if you will complete them then please return them to the FMLA/Disability box at the [employer] checkout desk within 5-7 business days. If you are not going to complete them without seeing the patient again then please let me know and I will call her and let her know.  Thank you!

## 2017-01-09 ENCOUNTER — Ambulatory Visit: Payer: 59 | Admitting: Physician Assistant

## 2017-01-09 ENCOUNTER — Telehealth: Payer: Self-pay | Admitting: Physician Assistant

## 2017-01-09 NOTE — Telephone Encounter (Signed)
Patient's appointment was cancelled again.  We will keep the fmla disability to the last date that was listed, which was 01/01/2017 (see note).  There have been a couple missed appointments, with parameters set that she would return to have forms.  I assume she is back at work. I will definitely be available to help with her anxiety symptoms, but we can not complete her disability forms any longer.  A discussion of not returning for follow up was discussed with her at the last visit and over the phone.  This was due to her initial visit with me, as well as reviewing her visit with PA nicole bush in 2017 where she too filled out forms retro-actively (see last notes and telephone calls).   She also noted that she has a primary care.Marland Kitchen..Marland Kitchen

## 2017-01-15 NOTE — Telephone Encounter (Signed)
Left message to return call 

## 2017-09-18 ENCOUNTER — Encounter: Payer: Self-pay | Admitting: Physician Assistant

## 2018-03-14 ENCOUNTER — Telehealth (HOSPITAL_COMMUNITY): Payer: Self-pay

## 2018-03-14 NOTE — Telephone Encounter (Signed)
Called left a message to call [facility] °

## 2018-05-10 ENCOUNTER — Emergency Department (HOSPITAL_COMMUNITY)
Admission: EM | Admit: 2018-05-10 | Discharge: 2018-05-10 | Disposition: A | Discharge status: Home / Self Care | Payer: No Typology Code available for payment source | Attending: Emergency Medicine | Admitting: Emergency Medicine

## 2018-05-10 ENCOUNTER — Other Ambulatory Visit: Payer: Self-pay

## 2018-05-10 ENCOUNTER — Emergency Department (HOSPITAL_COMMUNITY): Payer: No Typology Code available for payment source

## 2018-05-10 ENCOUNTER — Encounter (HOSPITAL_COMMUNITY): Payer: Self-pay | Admitting: Emergency Medicine

## 2018-05-10 DIAGNOSIS — Y939 Activity, unspecified: Secondary | ICD-10-CM | POA: Diagnosis not present

## 2018-05-10 DIAGNOSIS — F17200 Nicotine dependence, unspecified, uncomplicated: Secondary | ICD-10-CM | POA: Insufficient documentation

## 2018-05-10 DIAGNOSIS — Z79899 Other long term (current) drug therapy: Secondary | ICD-10-CM | POA: Diagnosis not present

## 2018-05-10 DIAGNOSIS — M545 Low back pain, unspecified: Secondary | ICD-10-CM

## 2018-05-10 DIAGNOSIS — Y929 Unspecified place or not applicable: Secondary | ICD-10-CM | POA: Diagnosis not present

## 2018-05-10 DIAGNOSIS — Y999 Unspecified external cause status: Secondary | ICD-10-CM | POA: Insufficient documentation

## 2018-05-10 LAB — URINALYSIS, ROUTINE W REFLEX MICROSCOPIC
Bilirubin Urine: NEGATIVE
Glucose, UA: NEGATIVE mg/dL
HGB URINE DIPSTICK: NEGATIVE
Ketones, ur: NEGATIVE mg/dL
Leukocytes, UA: NEGATIVE
Nitrite: NEGATIVE
PH: 6 (ref 5.0–8.0)
Protein, ur: NEGATIVE mg/dL
SPECIFIC GRAVITY, URINE: 1.024 (ref 1.005–1.030)

## 2018-05-10 LAB — POC URINE PREG, ED: PREG TEST UR: NEGATIVE

## 2018-05-10 MED ORDER — METHOCARBAMOL 500 MG PO TABS: 1000.0000 mg | ORAL_TABLET | Freq: Four times a day (QID) | ORAL | 0 refills | Status: DC

## 2018-05-10 MED ORDER — NAPROXEN 500 MG PO TABS: 500.0000 mg | ORAL_TABLET | Freq: Two times a day (BID) | ORAL | 0 refills | Status: DC
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
## 2018-05-10 NOTE — ED Notes (Signed)
Patient verbalizes understanding of discharge instructions. Opportunity for questioning and answers were provided. Armband removed by staff, pt discharged from ED.  
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
## 2018-05-16 ENCOUNTER — Encounter (HOSPITAL_COMMUNITY): Payer: Self-pay | Admitting: *Deleted

## 2018-05-20 ENCOUNTER — Encounter (HOSPITAL_COMMUNITY): Payer: Self-pay | Admitting: *Deleted

## 2018-05-20 ENCOUNTER — Ambulatory Visit (HOSPITAL_COMMUNITY)
Admission: RE | Admit: 2018-05-20 | Discharge: 2018-05-20 | Disposition: A | Discharge status: Home / Self Care | Payer: No Typology Code available for payment source | Source: Ambulatory Visit | Attending: Obstetrics and Gynecology | Admitting: Obstetrics and Gynecology

## 2018-05-20 ENCOUNTER — Encounter (HOSPITAL_COMMUNITY): Payer: Self-pay

## 2018-05-20 VITALS — BP 120/74 | Ht 65.0 in | Wt 112.0 lb

## 2018-05-20 DIAGNOSIS — Z1239 Encounter for other screening for malignant neoplasm of breast: Secondary | ICD-10-CM

## 2018-05-20 DIAGNOSIS — R87810 Cervical high risk human papillomavirus (HPV) DNA test positive: Secondary | ICD-10-CM

## 2018-05-20 DIAGNOSIS — R87610 Atypical squamous cells of undetermined significance on cytologic smear of cervix (ASC-US): Secondary | ICD-10-CM

## 2018-05-20 NOTE — Patient Instructions (Addendum)
Explained breast self awareness with Donna Lowery. Patient did not need a Pap smear today due to last Pap smear was 03/04/2018. Explained the colposcopy to patient the recommended follow-up for her abnormal Pap smear. [facility] for a colposcopy to follow-up for her abnormal Pap smear. Appointment scheduled for Monday, May 26, 2018 at 1355. Patient aware of appointment and will be there. Discussed smoking cessation with patient. Referred to the [facility] Quit[facility] and gave resources to the free smoking cessation classes at [facility]. Let patient know that a screening mammogram is recommended at age 30 unless clinically indicated prior. Donna Lowery verbalized understanding.  Brannock, Kathaleen Maserhristine Poll, RN 8:40 AM

## 2018-05-20 NOTE — Progress Notes (Signed)
[facility] due to having an abnormal Pap smear on 03/04/2018 and a colposcopy is recommended for follow-up.  Pap Smear: Pap smear not completed today. [facility] and ASCUS with positive HPV. [facility] for a colposcopy to follow-up for her abnormal Pap smear. Appointment scheduled for Monday, May 26, 2018 at 1355. Per patient has no history of an abnormal Pap smear. Last Pap smear result is in Epic.  Physical exam: Breasts Breasts symmetrical. No skin abnormalities bilateral breasts. No nipple retraction bilateral breasts. No nipple discharge bilateral breasts. No lymphadenopathy. No lumps palpated bilateral breasts. No complaints of pain or tenderness on exam. Screening mammogram recommended at age 30 unless clinically indicated prior.      Pelvic/Bimanual No Pap smear completed today since last Pap smear and HPV typing was 03/04/2018. Pap smear not indicated per [facility]CP guidelines.   Smoking History: Patient is a current smoker. Discussed smoking cessation with patient. Referred to the [facility] Quitline and gave resources to the free smoking cessation classes at [facility].  Patient Navigation: Patient education provided. Access to services provided for patient through [facility]CP program.   Breast and Cervical Cancer Risk Assessment: Patient has no family history of breast cancer, known genetic mutations, or radiation treatment to the chest before age 30. Patient has no history of cervical dysplasia, immunocompromised, or DES exposure in-utero. Breast Cancer risk assessment completed. No breast cancer risk calculated due to patient is less than 30 years old.

## 2018-05-20 NOTE — Addendum Note (Signed)
Encounter addended by: Priscille HeidelbergBrannock, Maryn Freelove P, RN on: 05/20/2018 8:50 AM  Actions taken: Sign clinical note

## 2018-05-26 ENCOUNTER — Other Ambulatory Visit (HOSPITAL_COMMUNITY)
Admission: RE | Admit: 2018-05-26 | Discharge: 2018-05-26 | Disposition: A | Discharge status: Home / Self Care | Payer: Medicaid Other | Source: Ambulatory Visit | Attending: Obstetrics & Gynecology | Admitting: Obstetrics & Gynecology

## 2018-05-26 ENCOUNTER — Encounter: Payer: Self-pay | Admitting: Obstetrics & Gynecology

## 2018-05-26 ENCOUNTER — Ambulatory Visit (INDEPENDENT_AMBULATORY_CARE_PROVIDER_SITE_OTHER): Payer: Self-pay | Admitting: Obstetrics & Gynecology

## 2018-05-26 VITALS — BP 101/67 | HR 93 | Wt 109.5 lb

## 2018-05-26 DIAGNOSIS — R87810 Cervical high risk human papillomavirus (HPV) DNA test positive: Secondary | ICD-10-CM | POA: Insufficient documentation

## 2018-05-26 DIAGNOSIS — R87610 Atypical squamous cells of undetermined significance on cytologic smear of cervix (ASC-US): Secondary | ICD-10-CM | POA: Insufficient documentation

## 2018-05-26 LAB — POCT PREGNANCY, URINE: PREG TEST UR: NEGATIVE

## 2018-05-26 NOTE — Progress Notes (Addendum)
   Subjective:    Patient ID: Sherol DadeLauren M Harner, female    DOB: Jul 16, 1985, 32 y.o.   MRN: [id number]  HPI 32 yo P2 here for a colpo for a ASCUS + HR HPV pap done at the health dept 9/19. Ivery Qualerich was also seen on that pap. She and her partner were treated.    Review of Systems She uses OCPs for contraception, considering a pregnancy and on MVI daily Thinks that she got Gardasil in high school, will confirm this    Objective:   Physical Exam Breathing, conversing, and ambulating normally Well nourished, well hydrated Black female, no apparent distress UPT negative, consent signed, time out done Cervix prepped with acetic acid. Transformation zone seen in its entirety. Colpo adequate. Entirely normal colposcopy ECC obtained. She tolerated the procedure well.     Assessment & Plan:  ASCUS + HR HPV pap with normal colpo If the ECC is negative, then she will need a pap with cotesting in a year. I will notify her via Mychart TOC for trich sent

## 2018-05-27 LAB — CERVICOVAGINAL ANCILLARY ONLY
Bacterial vaginitis: NEGATIVE
CHLAMYDIA, DNA PROBE: NEGATIVE
Candida vaginitis: NEGATIVE
Neisseria Gonorrhea: NEGATIVE
Trichomonas: NEGATIVE

## 2018-12-04 ENCOUNTER — Encounter (HOSPITAL_COMMUNITY): Payer: Self-pay | Admitting: Emergency Medicine

## 2018-12-04 ENCOUNTER — Ambulatory Visit (HOSPITAL_COMMUNITY)
Admission: EM | Admit: 2018-12-04 | Discharge: 2018-12-04 | Disposition: A | Discharge status: Home / Self Care | Payer: Managed Care, Other (non HMO)

## 2018-12-04 ENCOUNTER — Other Ambulatory Visit: Payer: Self-pay

## 2018-12-04 DIAGNOSIS — W57XXXA Bitten or stung by nonvenomous insect and other nonvenomous arthropods, initial encounter: Secondary | ICD-10-CM

## 2018-12-04 DIAGNOSIS — S70361A Insect bite (nonvenomous), right thigh, initial encounter: Secondary | ICD-10-CM | POA: Diagnosis not present

## 2018-12-04 NOTE — ED Provider Notes (Signed)
[place]    CSN: [id number] Arrival date & time: 12/04/18  1322     History   Chief Complaint Chief Complaint  Patient presents with  . Insect Bite    HPI Donna Lowery is a 33 y.o. female.   Pt is a 33 year old female that presents with insect bite to right upper leg.  Symptoms have been present since this past weekend.  Patient went to the beach and was bit by mosquito.  She has been itching the site.  Denies any pain to the site.  Came in today due to change in color.  No fever, chills, body aches or night sweats.  ROS per HPI      Past Medical History:  Diagnosis Date  . Anxiety     There are no active problems to display for this patient.   History reviewed. No pertinent surgical history.  OB History    Gravida  3   Para      Term      Preterm      AB  1   Living  2     SAB  1   TAB      Ectopic      Multiple      Live Births  2            Home Medications    Prior to Admission medications   Medication Sig Start Date End Date Taking? Authorizing Provider  NON FORMULARY Birth control pills   Yes [provider]  citalopram (CELEXA) 20 MG tablet Take 1 tablet (20 mg total) by mouth daily. Start with half tablet daily for 1 week, then increase to full tablet daily. Patient not taking: Reported on 05/20/2018 11/28/16 12/04/18  Ivar Drape D, PA  clonazePAM (KLONOPIN) 0.5 MG tablet Take 0.5 tablets (0.25 mg total) by mouth 2 (two) times daily as needed for anxiety. Patient not taking: Reported on 05/20/2018 12/06/16 12/04/18  Jaynee Eagles, PA-C  levonorgestrel [facility]) 20 MCG/24HR IUD 1 each by Intrauterine route once. Ongoing  12/04/18  [provider]    Family History Family History  Problem Relation Age of Onset  . Diabetes Maternal Grandmother     Social History Social History   Tobacco Use  . Smoking status: Current Every Day Smoker    Packs/day: 1.00    Years: 10.00    Pack years:  10.00  . Smokeless tobacco: Never Used  . Tobacco comment: gave her the [employer] literature  Substance Use Topics  . Alcohol use: Yes    Comment: occasional  . Drug use: No     Allergies   Patient has no known allergies.   Review of Systems Review of Systems   Physical Exam Triage Vital Signs ED Triage Vitals  Enc Vitals Group     BP 12/04/18 1346 105/70     Pulse Rate 12/04/18 1346 (!) 107     Resp 12/04/18 1346 16     Temp 12/04/18 1346 98.6 F (37 C)     Temp Source 12/04/18 1346 Oral     SpO2 12/04/18 1346 100 %     Weight --      Height --      Head Circumference --      Peak Flow --      Pain Score 12/04/18 1344 0     Pain Loc --      Pain Edu? --      Excl.  in GC? --    No data found.  Updated Vital Signs BP 105/70 (BP Location: Right Arm)   Pulse (!) 107   Temp 98.6 F (37 C) (Oral)   Resp 16   SpO2 100%   Visual Acuity Right Eye Distance:   Left Eye Distance:   Bilateral Distance:    Right Eye Near:   Left Eye Near:    Bilateral Near:     Physical Exam Vitals signs and nursing note reviewed.  Constitutional:      Appearance: Normal appearance.  HENT:     Head: Normocephalic and atraumatic.     Mouth/Throat:     Pharynx: Oropharynx is clear.  Pulmonary:     Effort: Pulmonary effort is normal.  Musculoskeletal: Normal range of motion.  Skin:    General: Skin is warm and dry.     Comments: Small insect bite noted to right thigh .  Nontender to touch. Busted capillary in center.   Neurological:     Mental Status: She is alert.  Psychiatric:        Mood and Affect: Mood normal.      UC Treatments / Results  Labs (all labs ordered are listed, but only abnormal results are displayed) Labs Reviewed - No data to display  EKG None  Radiology No results found.  Procedures Procedures (including critical care time)  Medications Ordered in UC Medications - No data to display  Initial Impression / Assessment and Plan / UC  Course  I have reviewed the triage vital signs and the nursing notes.  Pertinent labs & imaging results that were available during my care of the patient were reviewed by me and considered in my medical decision making (see chart for details).     Insect bite self limiting Hydrocortisone cream for itching and inflammation.  Follow up as needed for continued or worsening symptoms Final Clinical Impressions(s) / UC Diagnoses   Final diagnoses:  Insect bite of right thigh, initial encounter     Discharge Instructions     Nothing concerning on exam today.  He can put some hydrocortisone cream on the insect bite to help with itching and inflammation. Follow up as needed for continued or worsening symptoms     ED Prescriptions    None     Controlled Substance Prescriptions [place] Controlled Substance Registry consulted? Not Applicable   Janace ArisBast, Delylah Stanczyk A, NP 12/04/18 1409

## 2018-12-04 NOTE — ED Triage Notes (Signed)
Patient went to the beach this past weekend, thought this site to be a mosquito bite.  Has been itching.  Last night noticed bruised look to bump and this is what made patient come to ucc today No fever No sense of feeling bad or ill Site only itches.  Area is on right thigh

## 2018-12-04 NOTE — Discharge Instructions (Signed)
Nothing concerning on exam today.  He can put some hydrocortisone cream on the insect bite to help with itching and inflammation. Follow up as needed for continued or worsening symptoms

## 2019-01-30 ENCOUNTER — Other Ambulatory Visit: Payer: Self-pay

## 2019-01-30 ENCOUNTER — Encounter (HOSPITAL_COMMUNITY): Payer: Self-pay

## 2019-01-30 ENCOUNTER — Emergency Department (HOSPITAL_COMMUNITY)
Admission: EM | Admit: 2019-01-30 | Discharge: 2019-01-30 | Disposition: A | Discharge status: Home / Self Care | Payer: Managed Care, Other (non HMO) | Attending: Emergency Medicine | Admitting: Emergency Medicine

## 2019-01-30 DIAGNOSIS — Z79899 Other long term (current) drug therapy: Secondary | ICD-10-CM | POA: Insufficient documentation

## 2019-01-30 DIAGNOSIS — F17200 Nicotine dependence, unspecified, uncomplicated: Secondary | ICD-10-CM | POA: Insufficient documentation

## 2019-01-30 DIAGNOSIS — R1010 Upper abdominal pain, unspecified: Secondary | ICD-10-CM

## 2019-01-30 LAB — URINALYSIS, ROUTINE W REFLEX MICROSCOPIC
Bilirubin Urine: NEGATIVE
Glucose, UA: NEGATIVE mg/dL
Hgb urine dipstick: NEGATIVE
Ketones, ur: NEGATIVE mg/dL
Leukocytes,Ua: NEGATIVE
Nitrite: NEGATIVE
Protein, ur: NEGATIVE mg/dL
Specific Gravity, Urine: 1.023 (ref 1.005–1.030)
pH: 6 (ref 5.0–8.0)

## 2019-01-30 LAB — POC URINE PREG, ED: Preg Test, Ur: NEGATIVE

## 2019-01-30 NOTE — ED Triage Notes (Addendum)
Pt presents to ED with Abd pain starting yesterday.  Upper/mid abd pain rated 6/10.  Pt reports pain as sharp at times and constantly achy.  Pt reports taking a pregnancy test yesterday due to late cycle before the pain started and it was negative. Pt denies N/V/D and constipation.

## 2019-01-30 NOTE — Discharge Instructions (Signed)
Your pregnancy test is negative.  Your urine is normal. Follow-up with your primary care provider if symptoms persist. Return to the emergency department if you develop severely worsening abdominal pain, fever, uncontrollable vomiting, or new or concerning symptoms.

## 2019-01-30 NOTE — ED Provider Notes (Signed)
[facility] Provider Note   CSN: [id number] Arrival date & time: 01/30/19  1305     History   Chief Complaint Chief Complaint  Patient presents with  . Abdominal Pain    HPI Donna Lowery is a 33 y.o. female presenting to the emergency department with concern for possible pregnancy.  She states she is days late for her period.  She states late last night she began having some epigastric abdominal pain that was initially sharp, however comes and goes and is improving.  She has no associated symptoms including nausea, vomiting, diarrhea, constipation, fever, pelvic complaints, urinary symptoms.  She did not treat her symptoms with any medications.  No history of abdominal surgeries.  She states she is mainly concerned for possible pregnancy and wanted to be checked for this today.     The history is provided by the patient.    Past Medical History:  Diagnosis Date  . Anxiety     There are no active problems to display for this patient.   History reviewed. No pertinent surgical history.   OB History    Gravida  3   Para      Term      Preterm      AB  1   Living  2     SAB  1   TAB      Ectopic      Multiple      Live Births  2            Home Medications    Prior to Admission medications   Medication Sig Start Date End Date Taking? Authorizing Provider  NON FORMULARY Birth control pills    [provider]  citalopram (CELEXA) 20 MG tablet Take 1 tablet (20 mg total) by mouth daily. Start with half tablet daily for 1 week, then increase to full tablet daily. Patient not taking: Reported on 05/20/2018 11/28/16 12/04/18  Trena PlattEnglish, Stephanie D, PA  clonazePAM (KLONOPIN) 0.5 MG tablet Take 0.5 tablets (0.25 mg total) by mouth 2 (two) times daily as needed for anxiety. Patient not taking: Reported on 05/20/2018 12/06/16 12/04/18  Wallis BambergMani, Mario, PA-C  levonorgestrel [facility](MIRENA) 20 MCG/24HR IUD 1 each by  Intrauterine route once. Ongoing  12/04/18  [provider]    Family History Family History  Problem Relation Age of Onset  . Diabetes Maternal Grandmother     Social History Social History   Tobacco Use  . Smoking status: Current Every Day Smoker    Packs/day: 1.00    Years: 10.00    Pack years: 10.00  . Smokeless tobacco: Never Used  . Tobacco comment: gave her the [employer] literature  Substance Use Topics  . Alcohol use: Yes    Comment: occasional  . Drug use: No     Allergies   Patient has no known allergies.   Review of Systems Review of Systems  Gastrointestinal: Positive for abdominal pain.  All other systems reviewed and are negative.    Physical Exam Updated Vital Signs BP 125/85 (BP Location: Right Arm)   Pulse (!) 105   Temp 98.5 F (36.9 C) (Oral)   Resp 16   Ht 5\' 5"  (1.651 m)   Wt 52.2 kg   LMP 12/29/2018 (Exact Date)   SpO2 98%   BMI 19.14 kg/m   Physical Exam Vitals signs and nursing note reviewed.  Constitutional:      General: She is not in acute  distress.    Appearance: She is well-developed. She is not ill-appearing.  HENT:     Head: Normocephalic and atraumatic.  Eyes:     Conjunctiva/sclera: Conjunctivae normal.  Cardiovascular:     Rate and Rhythm: Normal rate.  Pulmonary:     Effort: Pulmonary effort is normal. No respiratory distress.     Breath sounds: Normal breath sounds.  Abdominal:     General: Bowel sounds are normal.     Palpations: Abdomen is soft.     Tenderness: There is no abdominal tenderness. There is no guarding or rebound.     Comments: She reports some discomfort with palpation of the suprapubic area, however no real tenderness is present.  Skin:    General: Skin is warm.  Neurological:     Mental Status: She is alert.  Psychiatric:        Behavior: Behavior normal.      ED Treatments / Results  Labs (all labs ordered are listed, but only abnormal results are displayed) Labs Reviewed   URINALYSIS, ROUTINE W REFLEX MICROSCOPIC - Abnormal; Notable for the following components:      Result Value   APPearance HAZY (*)    All other components within normal limits  POC URINE PREG, ED    EKG None  Radiology No results found.  Procedures Procedures (including critical care time)  Medications Ordered in ED Medications - No data to display   Initial Impression / Assessment and Plan / ED Course  I have reviewed the triage vital signs and the nursing notes.  Pertinent labs & imaging results that were available during my care of the patient were reviewed by me and considered in my medical decision making (see chart for details).        Patient presenting with improving epigastric abdominal pain that began last night.  She is mainly concerned for possible pregnancy as she is 2 days late for her menstrual period.  Her abdominal exam is reassuring, no peritoneal signs.  She reports some "discomfort" with palpation of the suprapubic area, however is denying any real tenderness.  She has no pelvic complaints.  Offered lab work, however patient states she would rather just have a pregnancy test and urine.  She can follow-up with her PCP.  Urine pregnancy negative, UA is negative.  Patient be discharged with instruction to follow-up with PCP if symptoms persist.  Strict return precautions discussed.  Patient agreeable to plan and stable discharge.  Discussed results, findings, treatment and follow up. Patient advised of return precautions. Patient verbalized understanding and agreed with plan.   Final Clinical Impressions(s) / ED Diagnoses   Final diagnoses:  Pain of upper abdomen    ED Discharge Orders    None       Derk Doubek, [place] N, PA-C 01/30/19 1434    Lajean Saver, MD 01/31/19 [phone]
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
## 2019-07-07 ENCOUNTER — Other Ambulatory Visit: Payer: Self-pay

## 2019-07-07 ENCOUNTER — Inpatient Hospital Stay (HOSPITAL_COMMUNITY)
Admission: EM | Admit: 2019-07-07 | Discharge: 2019-07-07 | Disposition: A | Discharge status: Home / Self Care | Payer: Medicaid Other | Attending: Obstetrics & Gynecology | Admitting: Obstetrics & Gynecology

## 2019-07-07 ENCOUNTER — Inpatient Hospital Stay (HOSPITAL_COMMUNITY): Payer: Medicaid Other

## 2019-07-07 ENCOUNTER — Encounter (HOSPITAL_COMMUNITY): Payer: Self-pay | Admitting: Obstetrics & Gynecology

## 2019-07-07 DIAGNOSIS — O219 Vomiting of pregnancy, unspecified: Secondary | ICD-10-CM | POA: Diagnosis not present

## 2019-07-07 DIAGNOSIS — O26891 Other specified pregnancy related conditions, first trimester: Secondary | ICD-10-CM | POA: Diagnosis not present

## 2019-07-07 DIAGNOSIS — R109 Unspecified abdominal pain: Secondary | ICD-10-CM | POA: Insufficient documentation

## 2019-07-07 DIAGNOSIS — Z3A01 Less than 8 weeks gestation of pregnancy: Secondary | ICD-10-CM | POA: Diagnosis not present

## 2019-07-07 DIAGNOSIS — F17210 Nicotine dependence, cigarettes, uncomplicated: Secondary | ICD-10-CM | POA: Diagnosis not present

## 2019-07-07 DIAGNOSIS — O99331 Smoking (tobacco) complicating pregnancy, first trimester: Secondary | ICD-10-CM | POA: Insufficient documentation

## 2019-07-07 DIAGNOSIS — O210 Mild hyperemesis gravidarum: Secondary | ICD-10-CM | POA: Insufficient documentation

## 2019-07-07 DIAGNOSIS — O3680X0 Pregnancy with inconclusive fetal viability, not applicable or unspecified: Secondary | ICD-10-CM

## 2019-07-07 LAB — URINALYSIS, ROUTINE W REFLEX MICROSCOPIC
Bilirubin Urine: NEGATIVE
Glucose, UA: NEGATIVE mg/dL
Hgb urine dipstick: NEGATIVE
Ketones, ur: NEGATIVE mg/dL
Leukocytes,Ua: NEGATIVE
Nitrite: NEGATIVE
Protein, ur: NEGATIVE mg/dL
Specific Gravity, Urine: 1.018 (ref 1.005–1.030)
pH: 6 (ref 5.0–8.0)

## 2019-07-07 LAB — POCT PREGNANCY, URINE: Preg Test, Ur: POSITIVE — AB

## 2019-07-07 LAB — CBC
HCT: 38.7 % (ref 36.0–46.0)
Hemoglobin: 12.8 g/dL (ref 12.0–15.0)
MCH: 31.8 pg (ref 26.0–34.0)
MCHC: 33.1 g/dL (ref 30.0–36.0)
MCV: 96.3 fL (ref 80.0–100.0)
Platelets: 152 10*3/uL (ref 150–400)
RBC: 4.02 MIL/uL (ref 3.87–5.11)
RDW: 12.2 % (ref 11.5–15.5)
WBC: 5.1 10*3/uL (ref 4.0–10.5)
nRBC: 0 % (ref 0.0–0.2)

## 2019-07-07 LAB — WET PREP, GENITAL
Sperm: NONE SEEN
Trich, Wet Prep: NONE SEEN
Yeast Wet Prep HPF POC: NONE SEEN

## 2019-07-07 LAB — HCG, QUANTITATIVE, PREGNANCY: hCG, Beta Chain, Quant, S: 554 m[IU]/mL — ABNORMAL HIGH (ref <5)

## 2019-07-07 MED ORDER — PROMETHAZINE HCL 25 MG PO TABS: 12.5000 mg | ORAL_TABLET | Freq: Four times a day (QID) | ORAL | 0 refills | Status: DC | PRN

## 2019-07-07 NOTE — ED Provider Notes (Signed)
MSE was initiated and I personally evaluated the patient and placed orders (if any) at  1:01 PM on July 07, 2019.  The patient appears stable so that the remainder of the MSE may be completed by another provider.  Patient here with N/V after eating. Pain for several days 7/10. Epigastric and RUQ.  Pregnancy test is positive . [id number] LMP 05/29/2020. On exam patient is well appearing. TTP in RUQ . Denies vaginal bleeding. Pain or discharge.  ? Vomiting due to pregnancy vs. Issues with gallbladder.  Patient stable for transfer to MAU     Arthor Captain, PA-C 07/07/19 1309    Tilden Fossa, MD 07/09/19 [phone]

## 2019-07-07 NOTE — ED Triage Notes (Signed)
Pt reports LMP Dec 19th. No prenatal care to date. Began having vomiting after eating, cramping pain. Denies bleeding discharge. VSS.

## 2019-07-07 NOTE — MAU Note (Signed)
Been having some stomach pain, RUQ, started on Sunday.  Hasn't been able to keep anything down, will throw up shortly after she eats.

## 2019-07-07 NOTE — MAU Provider Note (Signed)
History     CSN: [id number]  Arrival date and time: 07/07/19 1254   First Provider Initiated Contact with Patient 07/07/19 1511      Chief Complaint  Patient presents with  . Abdominal Pain  . Emesis   Donna Lowery is a 34 y.o. [phone] at [redacted]w[redacted]d who presents today with abdominal pain, nausea and vomiting. She denies any vaginal bleeding.   Emesis  This is a new problem. The current episode started in the past 7 days. The problem occurs 2 to 4 times per day. The problem has been unchanged. The emesis has an appearance of stomach contents. There has been no fever. Associated symptoms include abdominal pain. Pertinent negatives include no chills or fever. Risk factors: pregnancy  She has tried nothing for the symptoms.  Abdominal Pain This is a new problem. The current episode started in the past 7 days. The onset quality is gradual. The problem occurs constantly. The problem has been unchanged. The pain is located in the periumbilical region. The pain is moderate. The quality of the pain is colicky. Associated symptoms include nausea and vomiting. Pertinent negatives include no dysuria, fever or frequency. The pain is relieved by nothing. She has tried nothing for the symptoms.    OB History    Gravida  4   Para  2   Term  2   Preterm  0   AB  1   Living  2     SAB  0   TAB  1   Ectopic      Multiple      Live Births  2           Past Medical History:  Diagnosis Date  . Anxiety     History reviewed. No pertinent surgical history.  Family History  Problem Relation Age of Onset  . Healthy Mother   . Diabetes Maternal Grandmother     Social History   Tobacco Use  . Smoking status: Current Every Day Smoker    Packs/day: 1.00    Years: 10.00    Pack years: 10.00    Types: Cigarettes  . Smokeless tobacco: Never Used  . Tobacco comment: gave her the [employer] literature  Substance Use Topics  . Alcohol use: Yes    Comment: occasional  . Drug use:  No    Allergies: No Known Allergies  Medications Prior to Admission  Medication Sig Dispense Refill Last Dose  . NON FORMULARY Birth control pills       Review of Systems  Constitutional: Negative for chills and fever.  Gastrointestinal: Positive for abdominal pain, nausea and vomiting.  Genitourinary: Negative for dysuria, frequency, pelvic pain, vaginal bleeding and vaginal discharge.   Physical Exam   Blood pressure 112/68, pulse 84, temperature 98.5 F (36.9 C), temperature source Oral, resp. rate 16, height 5\' 5"  (1.651 m), weight 52.3 kg, last menstrual period 05/30/2019, SpO2 100 %.  Physical Exam  Nursing note and vitals reviewed. Constitutional: She is oriented to person, place, and time. She appears well-developed and well-nourished. No distress.  HENT:  Head: Normocephalic.  Cardiovascular: Normal rate.  Respiratory: Effort normal.  GI: Soft. There is no abdominal tenderness. There is no rebound.  Neurological: She is alert and oriented to person, place, and time.  Skin: Skin is warm and dry.  Psychiatric: She has a normal mood and affect.     Results for orders placed or performed during the hospital encounter of 07/07/19 (from the past 24  hour(s))  Pregnancy, urine POC     Status: Abnormal   Collection Time: 07/07/19  1:38 PM  Result Value Ref Range   Preg Test, Ur POSITIVE (A) NEGATIVE  Urinalysis, Routine w reflex microscopic     Status: None   Collection Time: 07/07/19  1:55 PM  Result Value Ref Range   Color, Urine YELLOW YELLOW   APPearance CLEAR CLEAR   Specific Gravity, Urine 1.018 1.005 - 1.030   pH 6.0 5.0 - 8.0   Glucose, UA NEGATIVE NEGATIVE mg/dL   Hgb urine dipstick NEGATIVE NEGATIVE   Bilirubin Urine NEGATIVE NEGATIVE   Ketones, ur NEGATIVE NEGATIVE mg/dL   Protein, ur NEGATIVE NEGATIVE mg/dL   Nitrite NEGATIVE NEGATIVE   Leukocytes,Ua NEGATIVE NEGATIVE  CBC     Status: None   Collection Time: 07/07/19  2:04 PM  Result Value Ref Range    WBC 5.1 4.0 - 10.5 K/uL   RBC 4.02 3.87 - 5.11 MIL/uL   Hemoglobin 12.8 12.0 - 15.0 g/dL   HCT 80.2 23.3 - 61.2 %   MCV 96.3 80.0 - 100.0 fL   MCH 31.8 26.0 - 34.0 pg   MCHC 33.1 30.0 - 36.0 g/dL   RDW 24.4 97.5 - 30.0 %   Platelets 152 150 - 400 K/uL   nRBC 0.0 0.0 - 0.2 %  hCG, quantitative, pregnancy     Status: Abnormal   Collection Time: 07/07/19  2:04 PM  Result Value Ref Range   hCG, Beta Chain, Quant, S 554 (H) <5 mIU/mL  Wet prep, genital     Status: Abnormal   Collection Time: 07/07/19  3:10 PM   Specimen: Vaginal  Result Value Ref Range   Yeast Wet Prep HPF POC NONE SEEN NONE SEEN   Trich, Wet Prep NONE SEEN NONE SEEN   Clue Cells Wet Prep HPF POC PRESENT (A) NONE SEEN   WBC, Wet Prep HPF POC FEW (A) NONE SEEN   Sperm NONE SEEN    US OB LESS THAN 14 WEEKS WITH OB TRANSVAGINAL  Result Date: 07/07/2019 CLINICAL DATA:  Abdominal pain in first trimester of pregnancy EXAM: OBSTETRIC <14 WK [place] AND TRANSVAGINAL OB US TECHNIQUE: Both transabdominal and transvaginal ultrasound examinations were performed for complete evaluation of the gestation as well as the maternal uterus, adnexal regions, and pelvic cul-de-sac. Transvaginal technique was performed to assess early pregnancy. COMPARISON:  None for this gestation FINDINGS: Intrauterine gestational sac: No gestational sac definitely visualize Yolk sac:  N/A Embryo:  N/A Cardiac Activity: N/A Heart Rate: N/A  bpm MSD:   mm    w     d CRL:    mm    w    d                  [place] EDC: Subchorionic hemorrhage:  N/A Maternal uterus/adnexae: Anteverted uterus with tiny myometrial cyst 3 mm diameter and adjacent small calcifications. No additional uterine mass or fluid collection. RIGHT ovary normal size and morphology 3.6 x 2.0 x 1.8 cm. LEFT ovary normal size and morphology 3.2 x 1.9 x 2.0 cm. No free pelvic fluid or adnexal masses. IMPRESSION: Tiny myometrial cyst 3 mm diameter with adjacent calcification. No definite intrauterine gestation  identified. Findings are consistent with pregnancy of unknown location. Differential diagnosis includes early ectopic pregnancy too early to visualize, spontaneous abortion, and ectopic pregnancy. Serial quantitative beta HCG and or follow-up ultrasound recommended to definitively exclude ectopic pregnancy. Electronically Signed   By: Loraine Leriche  Tyron Russell M.D.   On: 07/07/2019 15:56     MAU Course  Procedures  MDM   Assessment and Plan   1. Nausea/vomiting in pregnancy   2. Abdominal pain in pregnancy, first trimester   3. Abdominal pain during pregnancy in first trimester   4. Pregnancy of unknown anatomic location   5. [redacted] weeks gestation of pregnancy    DC home Comfort measures reviewed  1stTrimester precautions  Bleeding precautions Ectopic precautions RX: phenergan PRN #30  Return to MAU as needed Repeat HCG in 48 hours   Follow-up Information    [facility] Follow up.   Specialty: Obstetrics and Gynecology Why: Friday for lab work  Contact information: [address], [address] [id number] [place] [address] [phone]         Thressa Sheller DNP, CNM  07/07/19  4:18 PM

## 2019-07-07 NOTE — MAU Note (Signed)
Sent from ED for further eval.  Report of +HPT, pain in upper abd and vomiting.

## 2019-07-08 LAB — GC/CHLAMYDIA PROBE AMP (~~LOC~~) NOT AT ARMC
Chlamydia: NEGATIVE
Comment: NEGATIVE
Comment: NORMAL
Neisseria Gonorrhea: NEGATIVE

## 2019-07-10 ENCOUNTER — Ambulatory Visit: Payer: Medicaid Other

## 2019-07-14 ENCOUNTER — Ambulatory Visit (INDEPENDENT_AMBULATORY_CARE_PROVIDER_SITE_OTHER): Payer: Medicaid Other | Admitting: Lactation Services

## 2019-07-14 ENCOUNTER — Other Ambulatory Visit: Payer: Self-pay

## 2019-07-14 DIAGNOSIS — O26891 Other specified pregnancy related conditions, first trimester: Secondary | ICD-10-CM

## 2019-07-14 DIAGNOSIS — R109 Unspecified abdominal pain: Secondary | ICD-10-CM

## 2019-07-14 LAB — BETA HCG QUANT (REF LAB): hCG Quant: 40 m[IU]/mL

## 2019-07-14 NOTE — Progress Notes (Addendum)
Pt here for follow up Beta Hcg. Pt reports she is having some bleeding that started after her hospital visit. She reports she is bleeding like a period and changing her pad 3-4 times a day. She reports she is not soaking pads. Bleeding is sometimes heavier than other times. Pt reports she is having abdominal pain just below the umbilicus.  She reports the pain is up to a 7/10. She is not in pain in the office today. She reports the pain is increased after eating.   She reports she was taking her Phenergan and reports her nausea is better and usually throwing up once a day. She has not taken the Phenergan in 3 days since the vomiting is improved.   Pt aware that results will be back in about 2 hours and she will be called with results and follow up recommendations. Pt voiced understanding.   Reviewed seeking care at MAU if bleeding or pain increases. Pt voiced understanding.   HCG 40 today. Reviewed with Gerrit Heck, CNM. Pt called and notified that she has experienced a miscarriage. Reviewed with pt that she needs to follow up in 2 weeks for Beta Hcg. Pt tearful and reports she had support at home. Pt with no questions or concerns at this time. Message to front office to call and schedule pt for non stat Beta Hcg in 2 weeks.

## 2019-07-15 NOTE — Progress Notes (Signed)
Patient seen and assessed by nursing staff during this encounter. I have reviewed the chart and agree with the documentation and plan.  Cherre Robins, CNM 07/15/2019 8:58 AM

## 2019-07-18 ENCOUNTER — Encounter: Payer: Self-pay | Admitting: Student

## 2019-07-18 DIAGNOSIS — O3680X0 Pregnancy with inconclusive fetal viability, not applicable or unspecified: Secondary | ICD-10-CM | POA: Insufficient documentation

## 2019-07-23 ENCOUNTER — Other Ambulatory Visit: Payer: Self-pay | Admitting: *Deleted

## 2019-07-23 DIAGNOSIS — O3680X0 Pregnancy with inconclusive fetal viability, not applicable or unspecified: Secondary | ICD-10-CM

## 2019-07-23 DIAGNOSIS — O039 Complete or unspecified spontaneous abortion without complication: Secondary | ICD-10-CM

## 2019-07-28 ENCOUNTER — Other Ambulatory Visit: Payer: Self-pay

## 2019-07-28 ENCOUNTER — Other Ambulatory Visit: Payer: Medicaid Other

## 2019-07-28 DIAGNOSIS — O039 Complete or unspecified spontaneous abortion without complication: Secondary | ICD-10-CM

## 2019-07-29 LAB — BETA HCG QUANT (REF LAB): hCG Quant: <1 m[IU]/mL

## 2019-10-26 ENCOUNTER — Emergency Department (HOSPITAL_COMMUNITY)
Admission: EM | Admit: 2019-10-26 | Discharge: 2019-10-26 | Disposition: A | Discharge status: Home / Self Care | Payer: Medicaid Other | Attending: Emergency Medicine | Admitting: Emergency Medicine

## 2019-10-26 ENCOUNTER — Emergency Department (HOSPITAL_COMMUNITY)
Admission: EM | Admit: 2019-10-26 | Discharge: 2019-10-27 | Discharge status: Against Medical Advice | Payer: Medicaid Other | Source: Home / Self Care

## 2019-10-26 ENCOUNTER — Encounter (HOSPITAL_COMMUNITY): Payer: Self-pay

## 2019-10-26 ENCOUNTER — Other Ambulatory Visit: Payer: Self-pay

## 2019-10-26 DIAGNOSIS — O99891 Other specified diseases and conditions complicating pregnancy: Secondary | ICD-10-CM | POA: Insufficient documentation

## 2019-10-26 DIAGNOSIS — Z5321 Procedure and treatment not carried out due to patient leaving prior to being seen by health care provider: Secondary | ICD-10-CM | POA: Diagnosis not present

## 2019-10-26 DIAGNOSIS — Z3A00 Weeks of gestation of pregnancy not specified: Secondary | ICD-10-CM | POA: Insufficient documentation

## 2019-10-26 DIAGNOSIS — R109 Unspecified abdominal pain: Secondary | ICD-10-CM | POA: Insufficient documentation

## 2019-10-26 LAB — COMPREHENSIVE METABOLIC PANEL
ALT: 16 U/L (ref 0–44)
AST: 15 U/L (ref 15–41)
Albumin: 4.5 g/dL (ref 3.5–5.0)
Alkaline Phosphatase: 51 U/L (ref 38–126)
Anion gap: 8 (ref 5–15)
BUN: 12 mg/dL (ref 6–20)
CO2: 25 mmol/L (ref 22–32)
Calcium: 9.6 mg/dL (ref 8.9–10.3)
Chloride: 107 mmol/L (ref 98–111)
Creatinine, Ser: 0.82 mg/dL (ref 0.44–1.00)
GFR calc Af Amer: >60 mL/min (ref >60)
GFR calc non Af Amer: >60 mL/min (ref >60)
Glucose, Bld: 82 mg/dL (ref 70–99)
Potassium: 4.6 mmol/L (ref 3.5–5.1)
Sodium: 140 mmol/L (ref 135–145)
Total Bilirubin: 0.7 mg/dL (ref 0.3–1.2)
Total Protein: 7.8 g/dL (ref 6.5–8.1)

## 2019-10-26 LAB — URINALYSIS, ROUTINE W REFLEX MICROSCOPIC
Bilirubin Urine: NEGATIVE
Glucose, UA: NEGATIVE mg/dL
Hgb urine dipstick: NEGATIVE
Ketones, ur: NEGATIVE mg/dL
Nitrite: NEGATIVE
Protein, ur: NEGATIVE mg/dL
Specific Gravity, Urine: 1.027 (ref 1.005–1.030)
pH: 6 (ref 5.0–8.0)

## 2019-10-26 LAB — CBC
HCT: 43.7 % (ref 36.0–46.0)
Hemoglobin: 14.1 g/dL (ref 12.0–15.0)
MCH: 31.4 pg (ref 26.0–34.0)
MCHC: 32.3 g/dL (ref 30.0–36.0)
MCV: 97.3 fL (ref 80.0–100.0)
Platelets: 180 10*3/uL (ref 150–400)
RBC: 4.49 MIL/uL (ref 3.87–5.11)
RDW: 12.5 % (ref 11.5–15.5)
WBC: 6.4 10*3/uL (ref 4.0–10.5)
nRBC: 0 % (ref 0.0–0.2)

## 2019-10-26 LAB — I-STAT BETA HCG BLOOD, ED (MC, WL, AP ONLY): I-stat hCG, quantitative: 7.5 m[IU]/mL — ABNORMAL HIGH (ref <5)

## 2019-10-26 LAB — LIPASE, BLOOD: Lipase: 23 U/L (ref 11–51)

## 2019-10-26 LAB — PREGNANCY, URINE: Preg Test, Ur: NEGATIVE

## 2019-10-26 NOTE — ED Triage Notes (Signed)
Pt reports right sided abd pain for the past 2 days, pt also reports positive home pregnancy test, vaginal bleeding (spotting) on wed and thurs of last week but not at this time.

## 2019-10-26 NOTE — ED Triage Notes (Signed)
Patient here with right sided abdominal pain, it comes and goes.  Patient no nausea or vomiting. She did a home preg test which was positive.  Patient had bloodwork earlier in the day here, she was negative.  She did have spotting, just on toilet paper.

## 2019-10-27 ENCOUNTER — Inpatient Hospital Stay (HOSPITAL_COMMUNITY)
Admission: AD | Admit: 2019-10-27 | Discharge: 2019-10-27 | Disposition: A | Discharge status: Home / Self Care | Payer: Medicaid Other | Attending: Family Medicine | Admitting: Family Medicine

## 2019-10-27 DIAGNOSIS — Z3A00 Weeks of gestation of pregnancy not specified: Secondary | ICD-10-CM | POA: Insufficient documentation

## 2019-10-27 DIAGNOSIS — R1011 Right upper quadrant pain: Secondary | ICD-10-CM | POA: Diagnosis not present

## 2019-10-27 DIAGNOSIS — O99330 Smoking (tobacco) complicating pregnancy, unspecified trimester: Secondary | ICD-10-CM | POA: Insufficient documentation

## 2019-10-27 DIAGNOSIS — O3680X0 Pregnancy with inconclusive fetal viability, not applicable or unspecified: Secondary | ICD-10-CM | POA: Diagnosis not present

## 2019-10-27 DIAGNOSIS — F17210 Nicotine dependence, cigarettes, uncomplicated: Secondary | ICD-10-CM | POA: Diagnosis not present

## 2019-10-27 DIAGNOSIS — O26899 Other specified pregnancy related conditions, unspecified trimester: Secondary | ICD-10-CM | POA: Insufficient documentation

## 2019-10-27 LAB — HCG, QUANTITATIVE, PREGNANCY: hCG, Beta Chain, Quant, S: 8 m[IU]/mL — ABNORMAL HIGH (ref <5)

## 2019-10-27 LAB — HCG, SERUM, QUALITATIVE: Preg, Serum: POSITIVE — AB

## 2019-10-27 NOTE — ED Notes (Signed)
Patient states she was told that her lab work would only take an hour to come back. [facility] and she is not waiting for the results to come back

## 2019-10-27 NOTE — MAU Provider Note (Signed)
History     CSN: [id number]  Arrival date and time: 10/27/19 7829   First Provider Initiated Contact with Patient 10/27/19 1928      Chief Complaint  Patient presents with  . Abdominal Pain   Donna Lowery is a 34 y.o. [id number] at Unknown who presents today for follow up. She was seen in the ED on 10/26/2019 and had hcg drawn. She states that she waited in the ED for 4 hours and did not get the results. When she looked in [place] she saw the results. She tried to go back to review the results, but again waited for a long time. Finally today she was advised to come here. She reports that her LMP was 09/11/2019 and she has had some spotting but that has improved. She has also had right side pain. HCG on 10/26/2019 was 8.    Abdominal Pain This is a new problem. The current episode started 1 to 4 weeks ago. The problem occurs intermittently. The problem has been unchanged. The pain is located in the RUQ. The quality of the pain is cramping. The abdominal pain does not radiate. Nothing aggravates the pain. The pain is relieved by nothing. She has tried nothing for the symptoms.    OB History    Gravida  4   Para  2   Term  2   Preterm  0   AB  1   Living  2     SAB  0   TAB  1   Ectopic      Multiple      Live Births  2           Past Medical History:  Diagnosis Date  . Anxiety     No past surgical history on file.  Family History  Problem Relation Age of Onset  . Healthy Mother   . Diabetes Maternal Grandmother     Social History   Tobacco Use  . Smoking status: Current Every Day Smoker    Packs/day: 1.00    Years: 10.00    Pack years: 10.00    Types: Cigarettes  . Smokeless tobacco: Never Used  . Tobacco comment: gave her the [employer] literature  Substance Use Topics  . Alcohol use: Yes    Comment: occasional  . Drug use: No    Allergies: No Known Allergies  Medications Prior to Admission  Medication Sig Dispense Refill Last Dose  .  promethazine (PHENERGAN) 25 MG tablet Take 0.5-1 tablets (12.5-25 mg total) by mouth every 6 (six) hours as needed for nausea or vomiting. 30 tablet 0     Review of Systems  Gastrointestinal: Positive for abdominal pain.  All other systems reviewed and are negative.  Physical Exam   Blood pressure (!) 85/42, pulse 100, temperature 98.3 F (36.8 C), temperature source Oral, resp. rate 20, height 5\' 5"  (1.651 m), weight 49.6 kg.  Physical Exam  Nursing note and vitals reviewed. Constitutional: She is oriented to person, place, and time. She appears well-developed and well-nourished. No distress.  HENT:  Head: Normocephalic.  Cardiovascular: Normal rate.  Respiratory: Effort normal.  Neurological: She is alert and oriented to person, place, and time.   Results for orders placed or performed during the hospital encounter of 10/26/19 (from the past 72 hour(s))  hCG, quantitative, pregnancy     Status: Abnormal   Collection Time: 10/26/19 12:22 PM  Result Value Ref Range   hCG, Beta Chain, Quant, S 8 (H) <5  mIU/mL    Comment:          GEST. AGE      CONC.  (mIU/mL)   <=1 WEEK        5 - 50     2 WEEKS       50 - 500     3 WEEKS       100 - 10,000     4 WEEKS     1,000 - 30,000     5 WEEKS     3,500 - 115,000   6-8 WEEKS     12,000 - 270,000    12 WEEKS     15,000 - 220,000        FEMALE AND NON-PREGNANT FEMALE:     LESS THAN 5 mIU/mL Performed at [facility], [place] [address] [place], [place] [address]   hCG, serum, qualitative     Status: Abnormal   Collection Time: 10/26/19 10:35 PM  Result Value Ref Range   Preg, Serum POSITIVE (A) NEGATIVE    Comment:        THE SENSITIVITY OF THIS METHODOLOGY IS >10 mIU/mL. Performed at [facility], [place] [address] [place], [place] [address]    MAU Course  Procedures  MDM DW patient that repeat HCG in 48 hours would be the best way to evaluate what is going on with this pregnancy. Reviewed the possibility of  miscarriage, and that HCG is very low so that is concerning. We will be able to give a more definitive answer after repeat HCG.   Assessment and Plan   1. Pregnancy of unknown anatomic location    DC home 1st Trimester precautions  Bleeding precautions Ectopic precautions RX: none  Return to MAU as needed FU with OB as planned  [place] [facility] [place] [facility] Follow up.   Specialty: Obstetrics and Gynecology Why: 10/28/2009 at 9:00 am for repeat lab work  Contact information: [place] [address] [place], [place]  10/27/19  7:51 PM

## 2019-10-27 NOTE — MAU Note (Signed)
PT SAYS THIS PREG IN [facility]- WAS SAB ON 07-21-19.   NOW TOOK HPT LAST WEEK - POSITIVE , WED AND THURS SPOTTING WHEN WIPED . FEELS SHARP PAIN RIGHT SIDE - CAME [place] 2X YESTERDAY - UA, LABS - TOLD SOMEBODY WHEN BRING HER HERE.   RESULTS ON MY CHART - UPT WAS NEG AND BLOOD WAS POSITIVE .  CAME BACK TO [place] IN AFTERNOON- LABS- WAITED 3 HRS- SO LEFT. LABS IN [facility] WERE STILL POSITIVE. HAS CALLED ER - TOLD TO COME HERE.

## 2019-10-29 ENCOUNTER — Ambulatory Visit (INDEPENDENT_AMBULATORY_CARE_PROVIDER_SITE_OTHER): Payer: Medicaid Other | Admitting: General Practice

## 2019-10-29 ENCOUNTER — Other Ambulatory Visit: Payer: Self-pay

## 2019-10-29 ENCOUNTER — Encounter: Payer: Self-pay | Admitting: General Practice

## 2019-10-29 DIAGNOSIS — O3680X0 Pregnancy with inconclusive fetal viability, not applicable or unspecified: Secondary | ICD-10-CM

## 2019-10-29 LAB — BETA HCG QUANT (REF LAB): hCG Quant: 2 m[IU]/mL

## 2019-10-29 NOTE — Progress Notes (Signed)
Patient seen and assessed by nursing staff.  Agree with documentation and plan.  

## 2019-10-29 NOTE — Progress Notes (Signed)
Patient presents to office today for stat bhcg following up from MAU visit on 5/18. Patient reports spotting last week, no bleeding since then. Reports she went to MAU the other night because she was having sharp pains- states pain has decreased since then and currently rates at a 3-4. Patient states she has an appt with [place] Tuesday and wants to know if they can figure out what is going on as she also had a SAB in February. She feels this is another SAB. Per chart review, appt scheduled on 5/25 is a nurse visit for UPT. Discussed with patient appt would need to be cancelled as it is a nurse visit appt not with a provider. Told patient she could go ahead and schedule a follow up upon leaving today. Discussed bhcg results take approximately 2 hours to result & will be reviewed with a provider- we will then call her back with results. Patient verbalized understanding & provided call back number [phone].  Reviewed results with Dr Shawnie Pons who states decreasing bhcg results confirm SAB. No further follow up bhcg levels advised.   Called patient & informed her of results. Patient has a follow up appt scheduled 6/7 with a provider- she would like a sooner appt and will call for sooner appt if there is a cancellation.  Chase Caller RN BSN 10/29/19

## 2019-11-03 ENCOUNTER — Ambulatory Visit: Payer: Medicaid Other

## 2019-11-12 ENCOUNTER — Encounter: Payer: Self-pay | Admitting: Obstetrics and Gynecology

## 2019-11-12 ENCOUNTER — Ambulatory Visit (INDEPENDENT_AMBULATORY_CARE_PROVIDER_SITE_OTHER): Payer: Medicaid Other | Admitting: Obstetrics and Gynecology

## 2019-11-12 ENCOUNTER — Other Ambulatory Visit: Payer: Self-pay

## 2019-11-12 VITALS — BP 104/69 | HR 93 | Ht 65.0 in | Wt 108.8 lb

## 2019-11-12 DIAGNOSIS — N96 Recurrent pregnancy loss: Secondary | ICD-10-CM | POA: Diagnosis not present

## 2019-11-12 DIAGNOSIS — Z681 Body mass index (BMI) 19 or less, adult: Secondary | ICD-10-CM

## 2019-11-12 NOTE — Progress Notes (Signed)
GYNECOLOGY ENCOUNTER NOTE  History:     Donna Lowery is a 34 y.o. [phone] female here for f/u from recent SAB.  She has had 2 previous miscarriages in the last 6 months. Had two healthy pregnancies and now 2 miscarriages. Last miscarriage was very early; hcg 8. Vaginal deliveries with both preganancies, no health problems. New FOB with last 2 miscarriages. No pain or bleeding at this time.  Desires pregnancy.  Smoker/ 1/2 per day, no drug use.   Gynecologic History No LMP recorded. (Menstrual status: Other). Contraception: none- Declines   Obstetric History OB History  Gravida Para Term Preterm AB Living  4 2 2  0 2 2  SAB TAB Ectopic Multiple Live Births  1 1 0 0 2    # Outcome Date GA Lbr Len/2nd Weight Sex Delivery Anes PTL Lv  4 Term 2011     Vag-Spont  N LIV  3 Term 2008     Vag-Spont  N LIV  2 TAB           1 SAB             Past Medical History:  Diagnosis Date  . Anxiety     No past surgical history on file.  Current Outpatient Medications on File Prior to Visit  Medication Sig Dispense Refill  . promethazine (PHENERGAN) 25 MG tablet Take 0.5-1 tablets (12.5-25 mg total) by mouth every 6 (six) hours as needed for nausea or vomiting. (Patient not taking: Reported on 11/12/2019) 30 tablet 0  . [DISCONTINUED] citalopram (CELEXA) 20 MG tablet Take 1 tablet (20 mg total) by mouth daily. Start with half tablet daily for 1 week, then increase to full tablet daily. (Patient not taking: Reported on 05/20/2018) 30 tablet 1  . [DISCONTINUED] clonazePAM (KLONOPIN) 0.5 MG tablet Take 0.5 tablets (0.25 mg total) by mouth 2 (two) times daily as needed for anxiety. (Patient not taking: Reported on 05/20/2018) 20 tablet 0  . [DISCONTINUED] levonorgestrel (MIRENA) 20 MCG/24HR IUD 1 each by Intrauterine route once. Ongoing     No current facility-administered medications on file prior to visit.    No Known Allergies  Social History:  reports that she has been smoking  cigarettes. She has a 10.00 pack-year smoking history. She has never used smokeless tobacco. She reports current alcohol use. She reports that she does not use drugs.  Family History  Problem Relation Age of Onset  . Healthy Mother   . Diabetes Maternal Grandmother     The following portions of the patient's history were reviewed and updated as appropriate: allergies, current medications, past family history, past medical history, past social history, past surgical history and problem list.  Review of Systems Pertinent items noted in HPI and remainder of comprehensive ROS otherwise negative.  Physical Exam:  BP 104/69   Pulse 93   Ht 5\' 5"  (1.651 m)   Wt 108 lb 12.8 oz (49.4 kg)   BMI 18.11 kg/m  CONSTITUTIONAL: Well-developed, well-nourished female in no acute distress.  HENT:  Normocephalic, atraumatic SKIN: Skin is warm and dry.  NEUROLOGIC: Alert and oriented to person, place, and time.  PSYCHIATRIC: Normal mood and affect. Normal behavior.  CARDIOVASCULAR: Normal heart rate noted, regular rhythm RESPIRATORY: Clear to auscultation bilaterally. Effort and breath sounds normal, no problems with respiration noted. ABDOMEN: Soft, no distention noted.  No tenderness, rebound or guarding.   Assessment and Plan:   1. History of recurrent miscarriages  - Progesterone - HgB A1c -  TSH - Referral to genetics for karyotyping   2. BMI less than 19,adult    Carlous Olivares, Harolyn Rutherford, NP [facility] [employer], [facility]

## 2019-11-12 NOTE — Progress Notes (Signed)
Pt states is not having any bleeding nor pain.

## 2019-11-13 LAB — PROGESTERONE: Progesterone: 10.8 ng/mL

## 2019-11-13 LAB — HEMOGLOBIN A1C
Est. average glucose Bld gHb Est-mCnc: 103 mg/dL
Hgb A1c MFr Bld: 5.2 % (ref 4.8–5.6)

## 2019-11-13 LAB — TSH: TSH: 1.2 u[IU]/mL (ref 0.450–4.500)

## 2019-11-16 ENCOUNTER — Ambulatory Visit: Payer: Medicaid Other | Admitting: Nurse Practitioner

## 2020-01-26 HISTORY — PX: BREAST ENHANCEMENT SURGERY: SHX7

## 2020-04-17 IMAGING — US US OB < 14 WEEKS - US OB TV
1 series · 15 of 28 positions shown · non-contrast
Comparison: None for this gestation

CLINICAL DATA: Abdominal pain in first trimester of pregnancy

EXAM:
OBSTETRIC <14 WK US AND TRANSVAGINAL OB US
TECHNIQUE: Both transabdominal and transvaginal ultrasound examinations were
performed for complete evaluation of the gestation as well as the
maternal uterus, adnexal regions, and pelvic cul-de-sac.
Transvaginal technique was performed to assess early pregnancy.

[Series 1: us ob < 14 weeks - us ob tv · 15 of 74 slices shown]
[im 1/74]
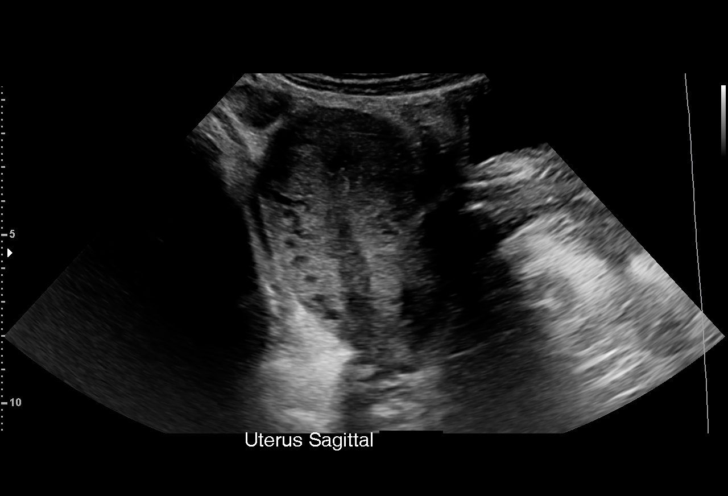
[im 6/74]
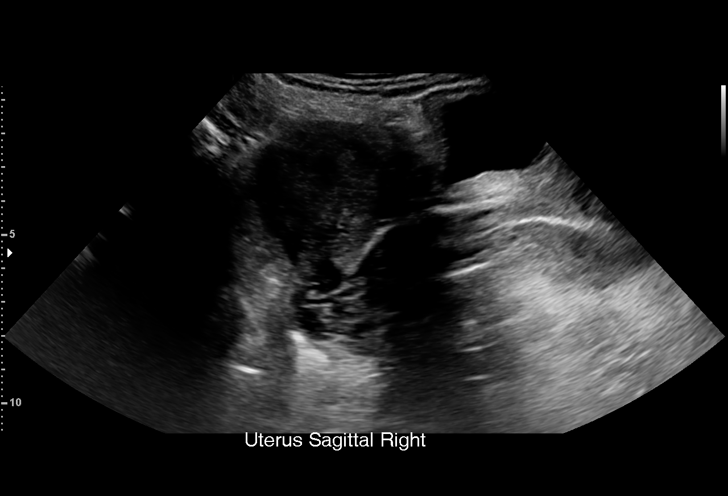
[im 11/74]
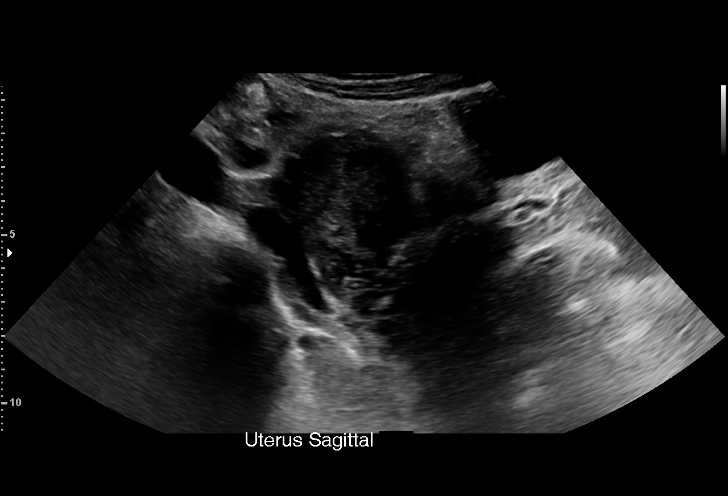
[im 17/74]
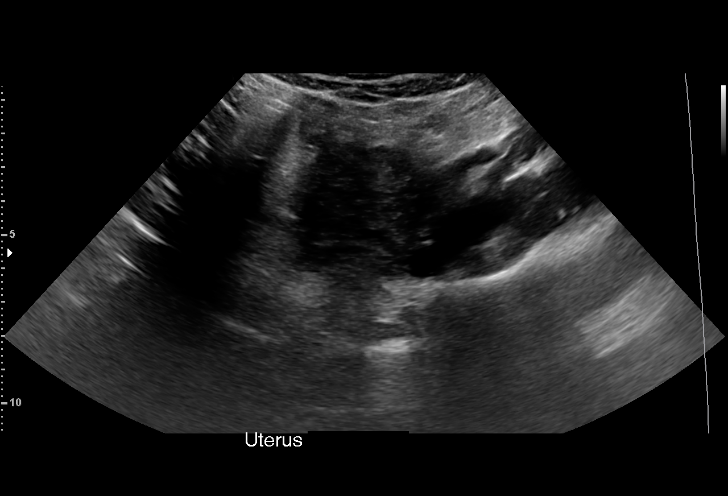
[im 22/74]
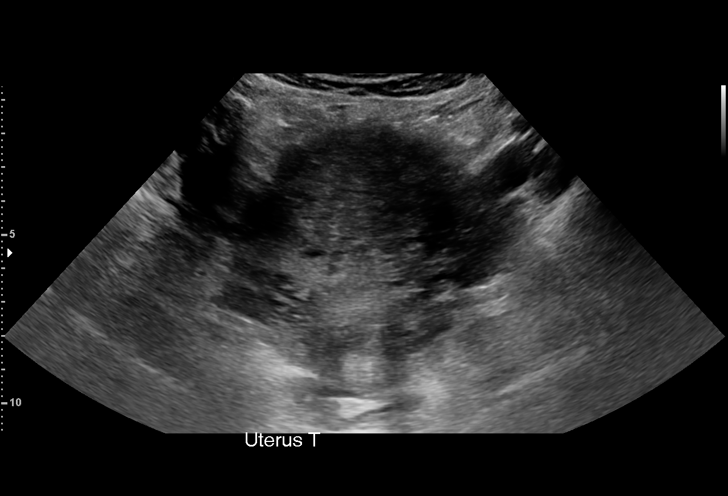
[im 28/74]
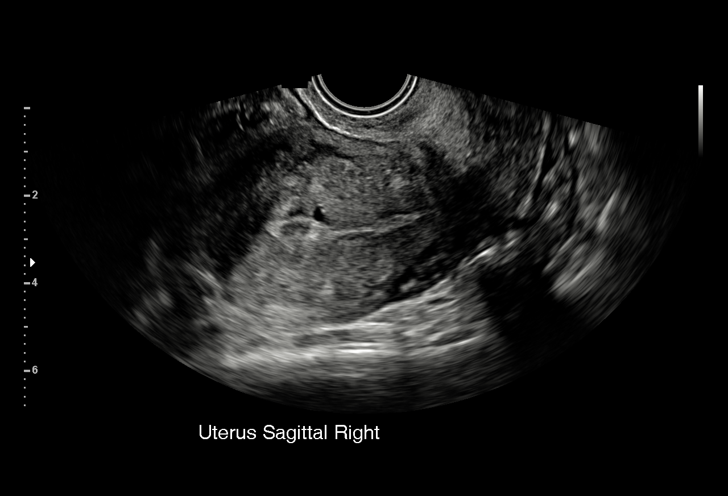
[im 33/74]
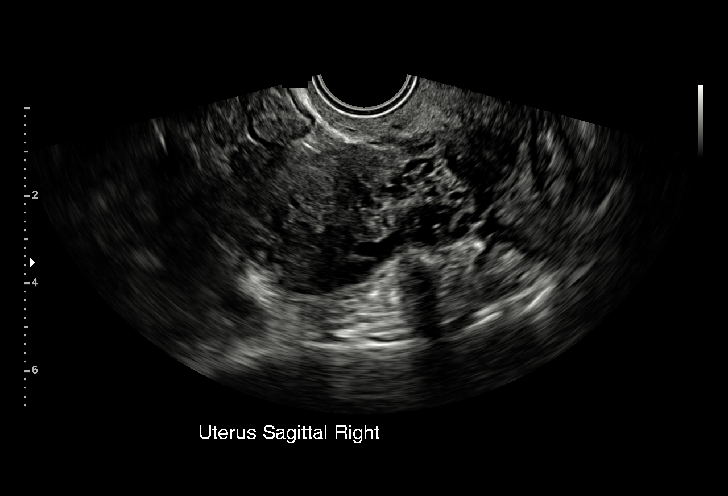
[im 38/74]
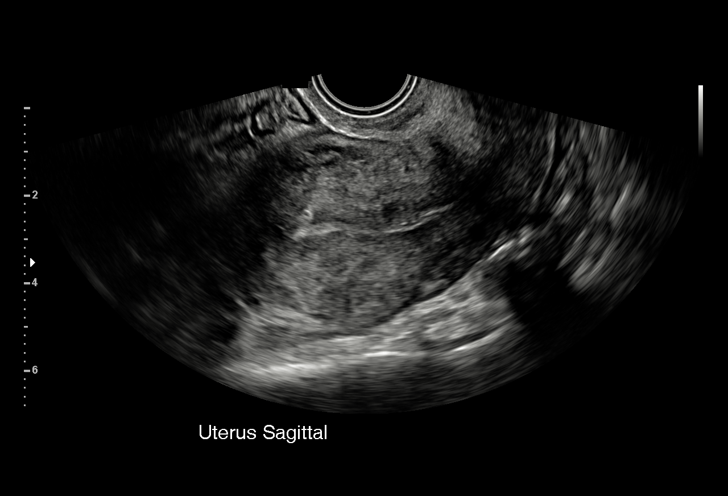
[im 41/74]
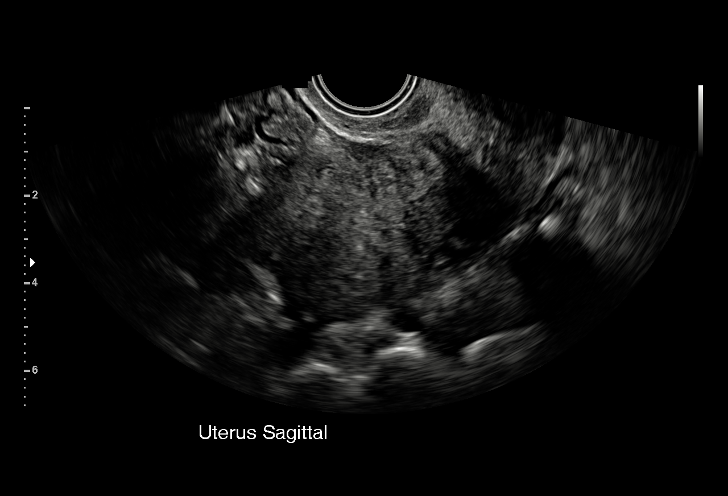
[im 46/74]
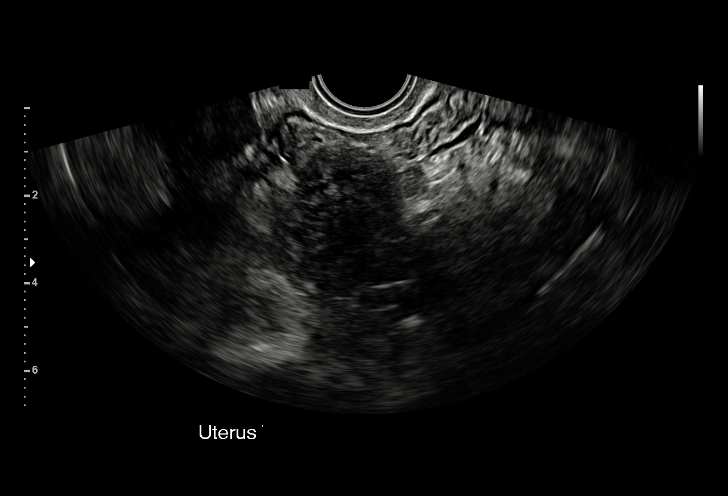
[im 52/74]
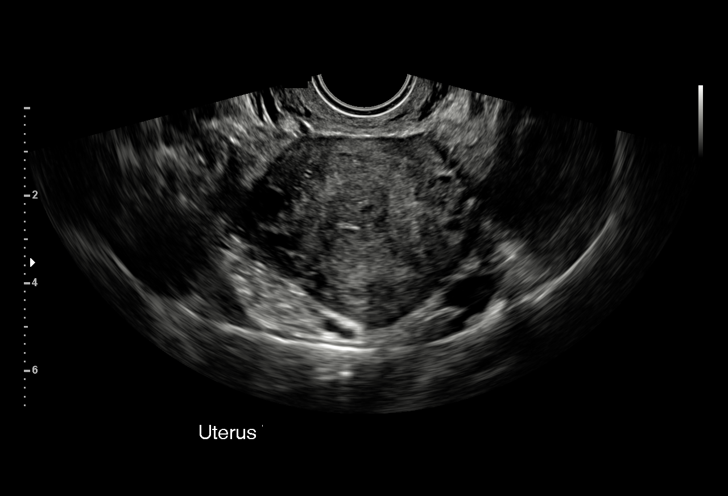
[im 57/74]
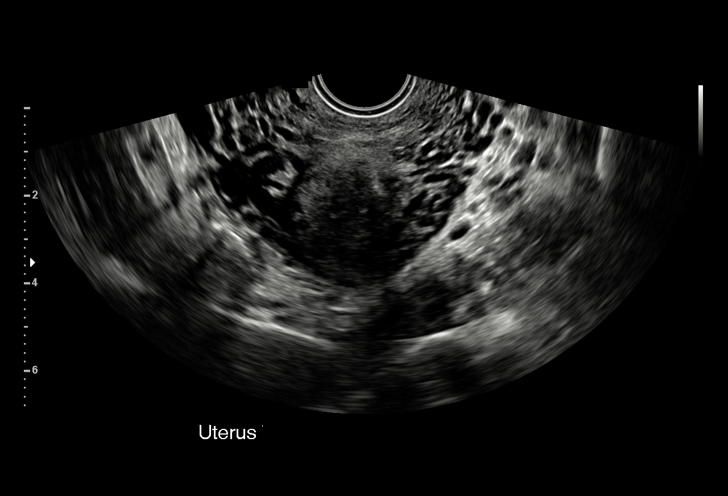
[im 63/74]
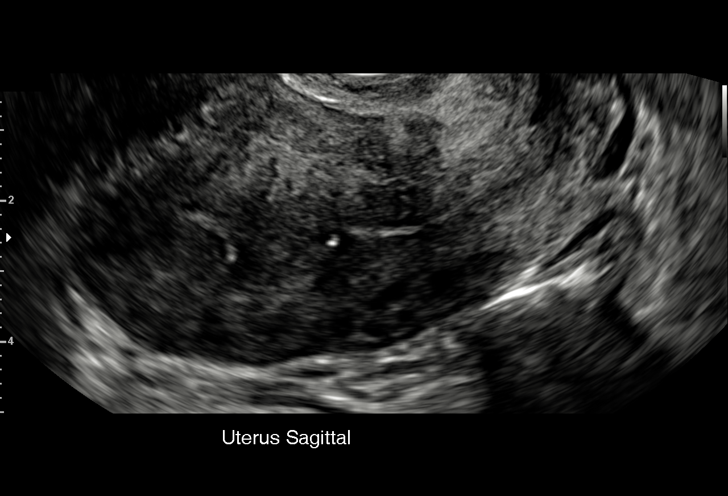
[im 68/74]
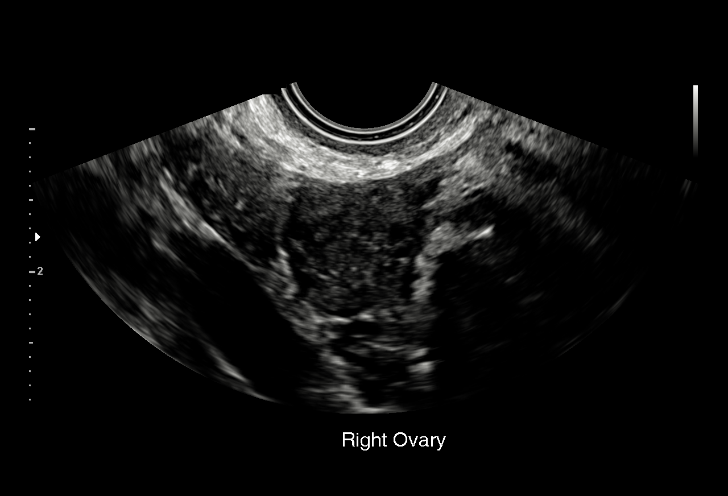
[im 74/74]
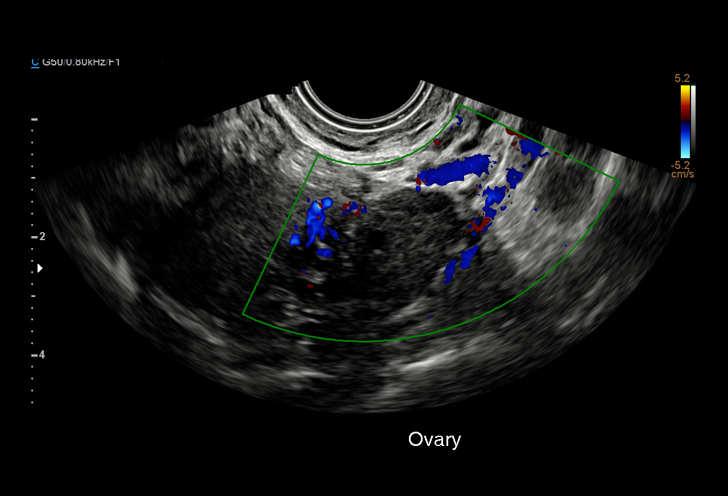

[15 of 28 positions shown; findings below may reference images not displayed]

FINDINGS: Intrauterine gestational sac: No gestational sac definitely
visualize

Yolk sac:  N/A

Embryo:  N/A

Cardiac Activity: N/A

Heart Rate: N/A  bpm

MSD:   mm    w     d

CRL:    mm    w    d                  US EDC:

Subchorionic hemorrhage:  N/A

Maternal uterus/adnexae:

Anteverted uterus with tiny myometrial cyst 3 mm diameter and
adjacent small calcifications.

No additional uterine mass or fluid collection.

RIGHT ovary normal size and morphology 3.6 x 2.0 x 1.8 cm.

LEFT ovary normal size and morphology 3.2 x 1.9 x 2.0 cm.

No free pelvic fluid or adnexal masses.
IMPRESSION: Tiny myometrial cyst 3 mm diameter with adjacent calcification.

No definite intrauterine gestation identified.

Findings are consistent with pregnancy of unknown location.

Differential diagnosis includes early ectopic pregnancy too early to
visualize, spontaneous abortion, and ectopic pregnancy.

Serial quantitative beta HCG and or follow-up ultrasound recommended
to definitively exclude ectopic pregnancy.

## 2020-12-07 ENCOUNTER — Ambulatory Visit (INDEPENDENT_AMBULATORY_CARE_PROVIDER_SITE_OTHER): Payer: Medicaid Other | Admitting: Family Medicine

## 2020-12-07 ENCOUNTER — Other Ambulatory Visit: Payer: Self-pay

## 2020-12-07 ENCOUNTER — Encounter: Payer: Self-pay | Admitting: Family Medicine

## 2020-12-07 ENCOUNTER — Other Ambulatory Visit (HOSPITAL_COMMUNITY)
Admission: RE | Admit: 2020-12-07 | Discharge: 2020-12-07 | Disposition: A | Discharge status: Home / Self Care | Payer: Medicaid Other | Source: Ambulatory Visit | Attending: Family Medicine | Admitting: Family Medicine

## 2020-12-07 VITALS — BP 103/77 | HR 91 | Ht 65.0 in | Wt 116.4 lb

## 2020-12-07 DIAGNOSIS — Z124 Encounter for screening for malignant neoplasm of cervix: Secondary | ICD-10-CM

## 2020-12-07 DIAGNOSIS — Z01411 Encounter for gynecological examination (general) (routine) with abnormal findings: Secondary | ICD-10-CM | POA: Diagnosis not present

## 2020-12-07 LAB — CBC
Hematocrit: 38.6 % (ref 34.0–46.6)
Hemoglobin: 12.4 g/dL (ref 11.1–15.9)
MCH: 30.6 pg (ref 26.6–33.0)
MCHC: 32.1 g/dL (ref 31.5–35.7)
MCV: 95 fL (ref 79–97)
Platelets: 180 10*3/uL (ref 150–450)
RBC: 4.05 x10E6/uL (ref 3.77–5.28)
RDW: 11.6 % — ABNORMAL LOW (ref 11.7–15.4)
WBC: 6.9 10*3/uL (ref 3.4–10.8)

## 2020-12-07 NOTE — Progress Notes (Signed)
Subjective:     Donna Lowery is a 35 y.o. female and is here for a comprehensive physical exam. The patient reports no problems. Going for breast augmentation in early August. Needs surgical clearance.  No significant PMH, PSH, SH, FH. On no meds. NKDA.  The following portions of the patient's history were reviewed and updated as appropriate: allergies, current medications, past family history, past medical history, past social history, past surgical history, and problem list.  Review of Systems Pertinent items noted in HPI and remainder of comprehensive ROS otherwise negative.   Objective:    BP 103/77   Pulse 91   Ht 5\' 5"  (1.651 m)   Wt 116 lb 6.4 oz (52.8 kg)   LMP 11/23/2020 (Exact Date)   BMI 19.37 kg/m  General appearance: alert, cooperative, and appears stated age Head: Normocephalic, without obvious abnormality, atraumatic Neck: no adenopathy, supple, symmetrical, trachea midline, and thyroid not enlarged, symmetric, no tenderness/mass/nodules Lungs: clear to auscultation bilaterally Breasts: normal appearance, no masses or tenderness Heart: regular rate and rhythm Abdomen: soft, non-tender; bowel sounds normal; no masses,  no organomegaly Pelvic: cervix normal in appearance, external genitalia normal, no adnexal masses or tenderness, no cervical motion tenderness, uterus normal size, shape, and consistency, and vagina normal without discharge Extremities: extremities normal, atraumatic, no cyanosis or edema Pulses: 2+ and symmetric Skin: Skin color, texture, turgor normal. No rashes or lesions Lymph nodes: Cervical, supraclavicular, and axillary nodes normal. Neurologic: Grossly normal    Assessment:    Healthy female exam.      Plan:  Encounter for gynecological examination with abnormal finding - Plan: CBC  Screening for malignant neoplasm of cervix - Plan: Cytology - PAP( [place]) Return in 1 year (on 12/07/2021).    See After Visit Summary for  Counseling Recommendations

## 2020-12-07 NOTE — Patient Instructions (Signed)
Preventive Care 21-33 Years Old, Female Preventive care refers to lifestyle choices and visits with your health care provider that can promote health and wellness. This includes: A yearly physical exam. This is also called an annual wellness visit. Regular dental and eye exams. Immunizations. Screening for certain conditions. Healthy lifestyle choices, such as: Eating a healthy diet. Getting regular exercise. Not using drugs or products that contain nicotine and tobacco. Limiting alcohol use. What can I expect for my preventive care visit? Physical exam Your health care provider may check your: Height and weight. These may be used to calculate your BMI (body mass index). BMI is a measurement that tells if you are at a healthy weight. Heart rate and blood pressure. Body temperature. Skin for abnormal spots. Counseling Your health care provider may ask you questions about your: Past medical problems. Family's medical history. Alcohol, tobacco, and drug use. Emotional well-being. Home life and relationship well-being. Sexual activity. Diet, exercise, and sleep habits. Work and work environment. Access to firearms. Method of birth control. Menstrual cycle. Pregnancy history. What immunizations do I need?  Vaccines are usually given at various ages, according to a schedule. Your health care provider will recommend vaccines for you based on your age, medicalhistory, and lifestyle or other factors, such as travel or where you work. What tests do I need?  Blood tests Lipid and cholesterol levels. These may be checked every 5 years starting at age 20. Hepatitis C test. Hepatitis B test. Screening Diabetes screening. This is done by checking your blood sugar (glucose) after you have not eaten for a while (fasting). STD (sexually transmitted disease) testing, if you are at risk. BRCA-related cancer screening. This may be done if you have a family history of breast, ovarian, tubal, or  peritoneal cancers. Pelvic exam and Pap test. This may be done every 3 years starting at age 21. Starting at age 30, this may be done every 5 years if you have a Pap test in combination with an HPV test. Talk with your health care provider about your test results, treatment options,and if necessary, the need for more tests. Follow these instructions at home: Eating and drinking  Eat a healthy diet that includes fresh fruits and vegetables, whole grains, lean protein, and low-fat dairy products. Take vitamin and mineral supplements as recommended by your health care provider. Do not drink alcohol if: Your health care provider tells you not to drink. You are pregnant, may be pregnant, or are planning to become pregnant. If you drink alcohol: Limit how much you have to 0-1 drink a day. Be aware of how much alcohol is in your drink. In the [place] one drink equals one 12 oz bottle of beer (355 mL), one 5 oz glass of wine (148 mL), or one 1 oz glass of hard liquor (44 mL).  Lifestyle Take daily care of your teeth and gums. Brush your teeth every morning and night with fluoride toothpaste. Floss one time each day. Stay active. Exercise for at least 30 minutes 5 or more days each week. Do not use any products that contain nicotine or tobacco, such as cigarettes, e-cigarettes, and chewing tobacco. If you need help quitting, ask your health care provider. Do not use drugs. If you are sexually active, practice safe sex. Use a condom or other form of protection to prevent STIs (sexually transmitted infections). If you do not wish to become pregnant, use a form of birth control. If you plan to become pregnant, see your health care   provider for a prepregnancy visit. Find healthy ways to cope with stress, such as: Meditation, yoga, or listening to music. Journaling. Talking to a trusted person. Spending time with friends and family. Safety Always wear your seat belt while driving or riding in a  vehicle. Do not drive: If you have been drinking alcohol. Do not ride with someone who has been drinking. When you are tired or distracted. While texting. Wear a helmet and other protective equipment during sports activities. If you have firearms in your house, make sure you follow all gun safety procedures. Seek help if you have been physically or sexually abused. What's next? Go to your health care provider once a year for an annual wellness visit. Ask your health care provider how often you should have your eyes and teeth checked. Stay up to date on all vaccines. This information is not intended to replace advice given to you by your health care provider. Make sure you discuss any questions you have with your healthcare provider. Document Revised: 01/24/2020 Document Reviewed: 02/06/2018 Elsevier Patient Education  2022 [employer].

## 2020-12-13 ENCOUNTER — Telehealth: Payer: Self-pay | Admitting: *Deleted

## 2020-12-13 LAB — CYTOLOGY - PAP
Comment: NEGATIVE
Comment: NEGATIVE
Diagnosis: HIGH — AB
HPV 16: NEGATIVE
HPV 18 / 45: NEGATIVE
High risk HPV: POSITIVE — AB

## 2020-12-13 NOTE — Telephone Encounter (Addendum)
-----   Message from Reva Bores, MD sent at 12/13/2020 12:50 PM EDT ----- Needs colpo  7/5  1430  Called pt and informed her of abnormal Pap requiring additional testing. Colposcopy procedure was briefly explained. Pt stated that she will be out of town during week of 7/17 and is having Breast Augmentation surgery on 8/4. I advised that we will schedule the procedure and she will be receive the appt information via Mychart.  Pt voiced understanding.

## 2020-12-15 ENCOUNTER — Ambulatory Visit (INDEPENDENT_AMBULATORY_CARE_PROVIDER_SITE_OTHER): Payer: Medicaid Other | Admitting: *Deleted

## 2020-12-15 ENCOUNTER — Other Ambulatory Visit: Payer: Self-pay

## 2020-12-15 ENCOUNTER — Encounter: Payer: Self-pay | Admitting: *Deleted

## 2020-12-15 ENCOUNTER — Ambulatory Visit: Payer: Medicaid Other

## 2020-12-15 VITALS — BP 105/70 | HR 89 | Ht 65.0 in | Wt 118.4 lb

## 2020-12-15 DIAGNOSIS — Z0130 Encounter for examination of blood pressure without abnormal findings: Secondary | ICD-10-CM

## 2020-12-15 NOTE — Progress Notes (Signed)
Patient seen and assessed by nursing staff.  Agree with documentation and plan.  

## 2020-12-15 NOTE — Progress Notes (Signed)
Here for bp check and weight check for medical clearance. Explained to patient we do not give medical clearance for surgery.  Patient previously seen by Dr. Shawnie Pons last week and she agreed to complete form per patient.  Contacted Dr. Shawnie Pons and form approved to be completed. Form completed and given to patient.  Zaakirah Kistner,RN

## 2021-01-11 DIAGNOSIS — Z20822 Contact with and (suspected) exposure to covid-19: Secondary | ICD-10-CM | POA: Diagnosis not present

## 2021-01-16 ENCOUNTER — Telehealth: Payer: Self-pay

## 2021-01-16 NOTE — Telephone Encounter (Signed)
Patient called front office to ask question about surgical clearance. Call transferred to clinical staff. Pt is requesting medical clearance for breast augmentation surgery later this month. This was approved by Shawnie Pons, MD at prior appt but was not completed in needed time frame. Explained this will be up to provider. Message sent to Crissie Reese, MD for review.

## 2021-01-18 ENCOUNTER — Other Ambulatory Visit: Payer: Self-pay

## 2021-01-18 ENCOUNTER — Encounter: Payer: Self-pay | Admitting: Family Medicine

## 2021-01-18 ENCOUNTER — Ambulatory Visit (INDEPENDENT_AMBULATORY_CARE_PROVIDER_SITE_OTHER): Payer: Medicaid Other | Admitting: Family Medicine

## 2021-01-18 ENCOUNTER — Other Ambulatory Visit (HOSPITAL_COMMUNITY)
Admission: RE | Admit: 2021-01-18 | Discharge: 2021-01-18 | Disposition: A | Discharge status: Home / Self Care | Payer: Medicaid Other | Source: Ambulatory Visit | Attending: Family Medicine | Admitting: Family Medicine

## 2021-01-18 VITALS — BP 109/72 | HR 101 | Wt 117.2 lb

## 2021-01-18 DIAGNOSIS — Z01818 Encounter for other preprocedural examination: Secondary | ICD-10-CM | POA: Insufficient documentation

## 2021-01-18 DIAGNOSIS — R87613 High grade squamous intraepithelial lesion on cytologic smear of cervix (HGSIL): Secondary | ICD-10-CM | POA: Diagnosis not present

## 2021-01-18 DIAGNOSIS — Z3202 Encounter for pregnancy test, result negative: Secondary | ICD-10-CM

## 2021-01-18 LAB — CBC
Hematocrit: 40.6 % (ref 34.0–46.6)
Hemoglobin: 13.4 g/dL (ref 11.1–15.9)
MCH: 30.7 pg (ref 26.6–33.0)
MCHC: 33 g/dL (ref 31.5–35.7)
MCV: 93 fL (ref 79–97)
Platelets: 194 10*3/uL (ref 150–450)
RBC: 4.36 x10E6/uL (ref 3.77–5.28)
RDW: 11.8 % (ref 11.7–15.4)
WBC: 6.5 10*3/uL (ref 3.4–10.8)

## 2021-01-18 LAB — POCT PREGNANCY, URINE: Preg Test, Ur: NEGATIVE

## 2021-01-18 NOTE — Assessment & Plan Note (Signed)
See procedure note.

## 2021-01-18 NOTE — Assessment & Plan Note (Signed)
Low risk for elective surgery. CBC obtained and form completed per patient request.

## 2021-01-18 NOTE — Progress Notes (Signed)
   GYNECOLOGY OFFICE VISIT NOTE  History:   Donna Lowery is a 35 y.o. [phone] here today for colposcopy, also requesting preop clearance for breast augmentation surgery.  Reports no medical issues No family hx of heart disease or anesthesia complications Routinely climbs multiple flights of stairs without any symptoms of chest pain or dyspnea  Health Maintenance Due  Topic Date Due   COVID-19 Vaccine (1) Never done   Hepatitis C Screening  Never done   TETANUS/TDAP  Never done   INFLUENZA VACCINE  01/09/2021    Past Medical History:  Diagnosis Date   Anxiety     History reviewed. No pertinent surgical history.  The following portions of the patient's history were reviewed and updated as appropriate: allergies, current medications, past family history, past medical history, past social history, past surgical history and problem list.   Health Maintenance:   Last pap: Lab Results  Component Value Date   DIAGPAP (A) 12/07/2020    - High grade squamous intraepithelial lesion (HSIL)   HPVHIGH Positive (A) 12/07/2020   Colpo today  Last mammogram:  N/a    Review of Systems:  Pertinent items noted in HPI and remainder of comprehensive ROS otherwise negative.  Physical Exam:  BP 109/72   Pulse (!) 101   Wt 117 lb 3.2 oz (53.2 kg)   BMI 19.50 kg/m  CONSTITUTIONAL: Well-developed, well-nourished female in no acute distress.  HEENT:  Normocephalic, atraumatic. External right and left ear normal. No scleral icterus.  NECK: Normal range of motion, supple, no masses noted on observation SKIN: No rash noted. Not diaphoretic. No erythema. No pallor. MUSCULOSKELETAL: Normal range of motion. No edema noted. NEUROLOGIC: Alert and oriented to person, place, and time. Normal muscle tone coordination.  PSYCHIATRIC: Normal mood and affect. Normal behavior. Normal judgment and thought content. RESPIRATORY: Effort normal, no problems with respiration noted ABDOMEN: No masses  noted. No other overt distention noted.   PELVIC: see colposcopy note  Labs and Imaging Results for orders placed or performed in visit on 01/18/21 (from the past 168 hour(s))  Pregnancy, urine POC   Collection Time: 01/18/21  4:19 PM  Result Value Ref Range   Preg Test, Ur NEGATIVE NEGATIVE   No results found.    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 CBC obtained and form completed per patient request.        Relevant Orders   CBC    Routine preventative health maintenance measures emphasized. Please refer to After Visit Summary for other counseling recommendations.   Return for once biopsy results are back.    Total face-to-face time with patient: 10 minutes.  Over 50% of encounter was spent on counseling and coordination of care.   Venora Maples, MD/MPH Attending Family Medicine Physician, [facility] for [facility], [facility]

## 2021-01-18 NOTE — Progress Notes (Signed)
    GYNECOLOGY CLINIC COLPOSCOPY PROCEDURE NOTE  35 y.o. [phone] here for colposcopy for pap finding of:  Lab Results  Component Value Date   DIAGPAP (A) 12/07/2020    - High grade squamous intraepithelial lesion (HSIL)   HPVHIGH Positive (A) 12/07/2020    Discussed role for HPV in cervical dysplasia, need for surveillance, nature of the procedure, and risks and benefits.  Pregnancy test: Lab Results  Component Value Date   PREGTESTUR NEGATIVE 01/18/2021    No Known Allergies  Patient given informed consent, signed copy in the chart, time out was performed.    Placed in lithotomy position. Cervix viewed with speculum and colposcope after application of acetic acid.   Colposcopy Adequacy Cervix fully visualized: Yes  SCJ fully visualized: No unable to see inferior and L lateral portions  Colposcopy Findings dense acetowhite lesion(s) noted at 10-2 o'clock, punctation noted at 10 o'clock, and abnormal vessels noted at 12 o'clock  Corresponding biopsies were obtained.    ECC specimen was obtained.  All specimens were labeled and sent to pathology.  Hemostatic measures: Pressure and Monsel's solution  Complications: none  Patient tolerated the procedure well.  OBGyn Exam  Colposcopy Impressions High grade features  Plan Treatment plan pending biopsy results, per patient preference they will be communicated by telephone.  Patient was given post procedure instructions.  Will follow up pathology and manage accordingly; patient will be contacted with results and recommendations.  Routine preventative health maintenance measures emphasized.  Venora Maples, MD/MPH Attending Family Medicine Physician, [facility] for [facility], [facility]

## 2021-01-19 DIAGNOSIS — Z20822 Contact with and (suspected) exposure to covid-19: Secondary | ICD-10-CM | POA: Diagnosis not present

## 2021-01-20 DIAGNOSIS — N870 Mild cervical dysplasia: Secondary | ICD-10-CM | POA: Diagnosis not present

## 2021-01-23 LAB — SURGICAL PATHOLOGY

## 2021-01-24 NOTE — Progress Notes (Signed)
Repeat pap in 1 year Notified by telephone per her request, all questions answered

## 2021-08-28 ENCOUNTER — Ambulatory Visit: Payer: Medicaid Other | Admitting: Obstetrics and Gynecology

## 2021-12-19 ENCOUNTER — Ambulatory Visit: Payer: Medicaid Other | Admitting: Obstetrics and Gynecology

## 2022-01-03 ENCOUNTER — Encounter: Payer: Self-pay | Admitting: Family Medicine

## 2022-01-03 ENCOUNTER — Other Ambulatory Visit (HOSPITAL_COMMUNITY)
Admission: RE | Admit: 2022-01-03 | Discharge: 2022-01-03 | Disposition: A | Discharge status: Home / Self Care | Payer: Medicaid Other | Source: Ambulatory Visit | Attending: Obstetrics and Gynecology | Admitting: Obstetrics and Gynecology

## 2022-01-03 ENCOUNTER — Other Ambulatory Visit: Payer: Self-pay

## 2022-01-03 ENCOUNTER — Ambulatory Visit (INDEPENDENT_AMBULATORY_CARE_PROVIDER_SITE_OTHER): Payer: Medicaid Other | Admitting: Family Medicine

## 2022-01-03 VITALS — BP 110/87 | HR 79 | Wt 120.6 lb

## 2022-01-03 DIAGNOSIS — R87613 High grade squamous intraepithelial lesion on cytologic smear of cervix (HGSIL): Secondary | ICD-10-CM

## 2022-01-03 DIAGNOSIS — Z01411 Encounter for gynecological examination (general) (routine) with abnormal findings: Secondary | ICD-10-CM | POA: Diagnosis not present

## 2022-01-03 NOTE — Progress Notes (Signed)
Patient had concerns about colposcopy that was does last August. I explained the provider reccommended her to repeat her pap smear the following year... which will be done during today's visit. STD screening was offered and patient accepted.  No further questions or concerns

## 2022-01-03 NOTE — Progress Notes (Signed)
   GYNECOLOGY ANNUAL PREVENTATIVE CARE ENCOUNTER NOTE  Subjective:   Donna Lowery is a 36 y.o. [phone] female here for a routine annual gynecologic exam.  Current complaints: Annual exam, needs pap today for prior history HSIL.   Denies abnormal vaginal bleeding, discharge, pelvic pain, problems with intercourse or other gynecologic concerns.    Gynecologic History Contraception: none Last Pap: HSIL in 2022 Last mammogram: NA.   Health Maintenance Due  Topic Date Due   COVID-19 Vaccine (1) Never done   TETANUS/TDAP  Never done    The following portions of the patient's history were reviewed and updated as appropriate: allergies, current medications, past family history, past medical history, past social history, past surgical history and problem list.  Review of Systems Pertinent items are noted in HPI.   Objective:  BP 110/87   Pulse 79   Wt 120 lb 9.6 oz (54.7 kg)   BMI 20.07 kg/m  CONSTITUTIONAL: Well-developed, well-nourished female in no acute distress.  HENT:  Normocephalic, atraumatic, External right and left ear normal. Oropharynx is clear and moist EYES:  No scleral icterus.  NECK: Normal range of motion, supple, no masses.  Normal thyroid.  SKIN: Skin is warm and dry. No rash noted. Not diaphoretic. No erythema. No pallor. NEUROLOGIC: Alert and oriented to person, place, and time. Normal reflexes, muscle tone coordination. No cranial nerve deficit noted. PSYCHIATRIC: Normal mood and affect. Normal behavior. Normal judgment and thought content. CARDIOVASCULAR: Normal heart rate noted, regular rhythm. 2+ distal pulses. RESPIRATORY: Effort and breath sounds normal, no problems with respiration noted. BREASTS: Symmetric in size. No masses, skin changes, nipple drainage, or lymphadenopathy. ABDOMEN: Soft,  no distention noted.  No tenderness, rebound or guarding.  PELVIC: Normal appearing external genitalia; normal appearing vaginal mucosa and cervix.  No abnormal  discharge noted.  Pap smear obtained.  Normal uterine size, no other palpable masses, no uterine or adnexal tenderness. MUSCULOSKELETAL: Normal range of motion.   Assessment and Plan:  1) Annual gynecologic examination with pap smear:  Will follow up results of pap smear and manage accordingly. STI screen ordered.  Routine preventative health maintenance measures emphasized.  2) Contraception counseling:  Reviewed in patient centered fashion. No method desired  1. Encounter for gynecological examination with abnormal finding - Cytology - PAP( [place]) - HIV Antibody (routine testing w rflx) - RPR - Hepatitis C Antibody - Hepatitis B Surface AntiGEN   Please refer to After Visit Summary for other counseling recommendations.   No follow-ups on file.  Federico Flake, MD, MPH, ABFM Attending Physician Center for [facility]

## 2022-01-04 LAB — HEPATITIS C ANTIBODY: Hep C Virus Ab: NONREACTIVE

## 2022-01-04 LAB — HEPATITIS B SURFACE ANTIGEN: Hepatitis B Surface Ag: NEGATIVE

## 2022-01-04 LAB — HIV ANTIBODY (ROUTINE TESTING W REFLEX): HIV Screen 4th Generation wRfx: NONREACTIVE

## 2022-01-04 LAB — RPR: RPR Ser Ql: NONREACTIVE

## 2022-01-05 ENCOUNTER — Encounter: Payer: Self-pay | Admitting: Family Medicine

## 2022-01-10 LAB — CYTOLOGY - PAP
Chlamydia: NEGATIVE
Comment: NEGATIVE
Comment: NEGATIVE
Comment: NEGATIVE
Comment: NORMAL
Diagnosis: UNDETERMINED — AB
High risk HPV: NEGATIVE
Neisseria Gonorrhea: NEGATIVE
Trichomonas: NEGATIVE

## 2022-01-11 ENCOUNTER — Telehealth: Payer: Self-pay

## 2022-01-11 NOTE — Telephone Encounter (Signed)
Call placed to pt. Spoke with pt. Pt given results and recommendations per Dr Alvester Morin. Pt verbalized understanding and agreeable to plan of care.  Phelix Fudala,RNC

## 2022-01-11 NOTE — Telephone Encounter (Signed)
-----   Message from Federico Flake, MD sent at 01/11/2022 10:13 AM EDT ----- Pap results have improved since last year.  Repeat pap in 1year

## 2022-08-30 ENCOUNTER — Encounter (HOSPITAL_BASED_OUTPATIENT_CLINIC_OR_DEPARTMENT_OTHER): Payer: Self-pay

## 2022-08-30 ENCOUNTER — Other Ambulatory Visit: Payer: Self-pay

## 2022-08-30 ENCOUNTER — Emergency Department (HOSPITAL_BASED_OUTPATIENT_CLINIC_OR_DEPARTMENT_OTHER)
Admission: EM | Admit: 2022-08-30 | Discharge: 2022-08-30 | Disposition: A | Discharge status: Home / Self Care | Payer: Medicaid Other | Attending: Emergency Medicine | Admitting: Emergency Medicine

## 2022-08-30 DIAGNOSIS — R21 Rash and other nonspecific skin eruption: Secondary | ICD-10-CM | POA: Insufficient documentation

## 2022-08-30 DIAGNOSIS — Z5321 Procedure and treatment not carried out due to patient leaving prior to being seen by health care provider: Secondary | ICD-10-CM | POA: Insufficient documentation

## 2022-08-30 NOTE — ED Notes (Signed)
Patient called for Examination Room. Patient not visualized in Waiting Areas, BR or Outside. Per Registration Staff, patient LWBS from Waiting area. To be discharged accordingly.

## 2022-08-30 NOTE — ED Triage Notes (Addendum)
Patient here POV from Home.  Notes Generalized Intermittent Rash to Body over a few days. No Drainage or Fever. Painful and Itchy. New Body Wash recently.   NAD Noted during Triage. A&Ox4. GCS 15. Ambulatory.

## 2022-09-04 ENCOUNTER — Ambulatory Visit: Payer: Medicaid Other | Admitting: Nurse Practitioner

## 2022-09-05 DIAGNOSIS — R21 Rash and other nonspecific skin eruption: Secondary | ICD-10-CM | POA: Diagnosis not present

## 2022-10-01 ENCOUNTER — Ambulatory Visit: Payer: Medicaid Other | Admitting: Medical

## 2022-10-08 ENCOUNTER — Emergency Department (HOSPITAL_BASED_OUTPATIENT_CLINIC_OR_DEPARTMENT_OTHER): Payer: Medicaid Other

## 2022-10-08 ENCOUNTER — Other Ambulatory Visit: Payer: Self-pay

## 2022-10-08 ENCOUNTER — Encounter (HOSPITAL_BASED_OUTPATIENT_CLINIC_OR_DEPARTMENT_OTHER): Payer: Self-pay

## 2022-10-08 ENCOUNTER — Emergency Department (HOSPITAL_BASED_OUTPATIENT_CLINIC_OR_DEPARTMENT_OTHER)
Admission: EM | Admit: 2022-10-08 | Discharge: 2022-10-08 | Disposition: A | Discharge status: Home / Self Care | Payer: Medicaid Other | Attending: Emergency Medicine | Admitting: Emergency Medicine

## 2022-10-08 DIAGNOSIS — S99921A Unspecified injury of right foot, initial encounter: Secondary | ICD-10-CM | POA: Diagnosis not present

## 2022-10-08 DIAGNOSIS — M25571 Pain in right ankle and joints of right foot: Secondary | ICD-10-CM | POA: Insufficient documentation
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
## 2022-10-08 NOTE — Discharge Instructions (Signed)
Your x-ray did not show any broken bones.  Please follow-up with your doctor in the office.  Take 4 over the counter ibuprofen tablets 3 times a day or 2 over-the-counter naproxen tablets twice a day for pain. Also take tylenol 1000mg (2 extra strength) four times a day.
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
## 2022-10-08 NOTE — ED Notes (Signed)
Pt verbalized understanding of d/c instructions, meds, and followup care. Denies questions. VSS, no distress noted. Steady gait to exit with all belongings.  ?

## 2023-02-26 ENCOUNTER — Ambulatory Visit: Payer: Medicaid Other | Admitting: Obstetrics & Gynecology

## 2023-06-12 NOTE — L&D Delivery Note (Signed)
 OB/GYN Faculty Practice Delivery Note  Donna Lowery is a 38 y.o. [id number] s/p SVD at [redacted]w[redacted]d. She was admitted for SOL.   ROM: rupture date, rupture time, delivery date, or delivery time have not been documented with clear fluid GBS Status: negative Maximum Maternal Temperature: 97.63F  Labor Progress: Initial SVE 3cm, progressed expectantly to fully dilated/+2  Delivery Date/Time: 03/31/2024 0644 Delivery: Called to room and patient was complete and pushing. Head delivered ROA. No nuchal cord present. Shoulder and body delivered in usual fashion. Infant with spontaneous cry, placed on mother's abdomen, dried and stimulated. Cord clamped x 2 after cord stopped pulsing, and cut by father of the baby. Cord blood drawn. Placenta delivered spontaneously, intact, with 3-vessel cord. Fundus firm with massage and Pitocin. Labia, perineum, vagina, and cervix inspected, 1st degree perineal laceration found, hemostatic and not repaired.   Placenta: Intact, delivered spontaneously Complications: None Lacerations: 1st degree perineal, hemostatic and not repaired EBL: Analgesia: None  Infant: Viable female  APGARs 9,9  3250g  Donna Lowery [name], MD 03/31/2024, 7:27 AM

## 2023-07-24 ENCOUNTER — Ambulatory Visit: Payer: Medicaid Other | Admitting: Family Medicine

## 2023-08-14 ENCOUNTER — Ambulatory Visit (INDEPENDENT_AMBULATORY_CARE_PROVIDER_SITE_OTHER)

## 2023-08-14 VITALS — BP 111/70 | HR 93 | Wt 108.0 lb

## 2023-08-14 DIAGNOSIS — Z3201 Encounter for pregnancy test, result positive: Secondary | ICD-10-CM | POA: Diagnosis not present

## 2023-08-14 LAB — POCT URINE PREGNANCY: Preg Test, Ur: POSITIVE — AB

## 2023-08-14 NOTE — Progress Notes (Signed)
.  Donna Lowery here for a UPT. Pt had a positive upt at home. LMP is 07/08/23.      UPT in office Positive.    Reviewed medications and informed to start a PNV, if not already. Pt to follow up in 3 weeks for New OB visit.

## 2023-08-17 ENCOUNTER — Encounter: Payer: Self-pay | Admitting: Obstetrics and Gynecology

## 2023-09-04 ENCOUNTER — Other Ambulatory Visit (INDEPENDENT_AMBULATORY_CARE_PROVIDER_SITE_OTHER): Payer: Self-pay

## 2023-09-04 ENCOUNTER — Ambulatory Visit: Admitting: *Deleted

## 2023-09-04 VITALS — BP 108/75 | HR 80 | Wt 115.6 lb

## 2023-09-04 DIAGNOSIS — O3680X0 Pregnancy with inconclusive fetal viability, not applicable or unspecified: Secondary | ICD-10-CM

## 2023-09-04 DIAGNOSIS — Z3480 Encounter for supervision of other normal pregnancy, unspecified trimester: Secondary | ICD-10-CM | POA: Insufficient documentation

## 2023-09-04 DIAGNOSIS — Z1339 Encounter for screening examination for other mental health and behavioral disorders: Secondary | ICD-10-CM

## 2023-09-04 DIAGNOSIS — Z3481 Encounter for supervision of other normal pregnancy, first trimester: Secondary | ICD-10-CM | POA: Diagnosis not present

## 2023-09-04 DIAGNOSIS — Z3A08 8 weeks gestation of pregnancy: Secondary | ICD-10-CM

## 2023-09-04 MED ORDER — BLOOD PRESSURE KIT DEVI
1.0000 | 0 refills | Status: DC
Start: 2023-09-04 — End: 2024-04-01

## 2023-09-04 NOTE — Progress Notes (Signed)
 New OB Intake  I connected with Donna Lowery  on 09/04/23 at 10:15 AM EDT by In Person Visit and verified that I am speaking with the correct person using two identifiers. Nurse is located at [facility] and pt is located at [place].  I discussed the limitations, risks, security and privacy concerns of performing an evaluation and management service by telephone and the availability of in person appointments. I also discussed with the patient that there may be a patient responsible charge related to this service. The patient expressed understanding and agreed to proceed.  I explained I am completing New OB Intake today. We discussed EDD of 04/13/2024, by Last Menstrual Period. Pt is [id number]. I reviewed her allergies, medications and Medical/Surgical/OB history.    Patient Active Problem List   Diagnosis Date Noted   HSIL on Pap smear of cervix 01/18/2021   Pre-op examination 01/18/2021   BMI less than 19,adult 11/12/2019    Concerns addressed today  Delivery Plans Plans to deliver at [facility]. Discussed the nature of our practice with multiple providers including residents and students. Due to the size of the practice, the delivering provider may not be the same as those providing prenatal care.   Patient is not interested in water birth. Offered upcoming OB visit with CNM to discuss further.  MyChart/Babyscripts MyChart access verified. I explained pt will have some visits in office and some virtually. Babyscripts instructions given and order placed. Patient verifies receipt of registration text/e-mail. Account successfully created and app downloaded. If patient is a candidate for Optimized scheduling, add to sticky note.   Blood Pressure Cuff/Weight Scale Blood pressure cuff ordered for patient to pick-up from [employer]. Explained after first prenatal appt pt will check weekly and document in Babyscripts. Patient does not have weight scale; patient may purchase if they desire to  track weight weekly in Babyscripts.  Anatomy US Explained first scheduled [place] will be around 19 weeks. Anatomy US scheduled for TBD at TBD.  Interested in [place]? If yes, send referral and doula dot phrase.   Is patient a candidate for Babyscripts Optimization? Yes   First visit review I reviewed new OB appt with patient. Explained pt will be seen by Dr. Donavan Foil at first visit. Discussed Avelina Laine genetic screening with patient. Requests Panorama and Horizon.. Routine prenatal labs  OB Urine only collected at today's visit. Initial OB labs deferred to New OB visit.    Last Pap Diagnosis  Date Value Ref Range Status  01/03/2022 (A)  Final   - Atypical squamous cells of undetermined significance (ASC-US)    Harrel Lemon, RN 09/04/2023  11:05 AM

## 2023-09-04 NOTE — Patient Instructions (Signed)

## 2023-09-07 LAB — URINE CULTURE, OB REFLEX

## 2023-09-07 LAB — CULTURE, OB URINE

## 2023-09-10 ENCOUNTER — Other Ambulatory Visit: Payer: Self-pay

## 2023-09-10 MED ORDER — CEPHALEXIN 500 MG PO CAPS: 500.0000 mg | ORAL_CAPSULE | Freq: Three times a day (TID) | ORAL | 0 refills | Status: DC

## 2023-09-10 NOTE — Progress Notes (Signed)
 Keflex rx sent per dr. Donavan Foil for UTI

## 2023-09-12 ENCOUNTER — Encounter: Admitting: Certified Nurse Midwife

## 2023-09-12 ENCOUNTER — Encounter: Admitting: Obstetrics and Gynecology

## 2023-09-12 DIAGNOSIS — Z3A09 9 weeks gestation of pregnancy: Secondary | ICD-10-CM

## 2023-09-12 DIAGNOSIS — O09521 Supervision of elderly multigravida, first trimester: Secondary | ICD-10-CM

## 2023-09-12 DIAGNOSIS — R87613 High grade squamous intraepithelial lesion on cytologic smear of cervix (HGSIL): Secondary | ICD-10-CM

## 2023-09-12 DIAGNOSIS — O0991 Supervision of high risk pregnancy, unspecified, first trimester: Secondary | ICD-10-CM

## 2023-10-01 ENCOUNTER — Other Ambulatory Visit (HOSPITAL_COMMUNITY)
Admission: RE | Admit: 2023-10-01 | Discharge: 2023-10-01 | Disposition: A | Discharge status: Home / Self Care | Source: Ambulatory Visit

## 2023-10-01 ENCOUNTER — Encounter: Payer: Self-pay | Admitting: Obstetrics

## 2023-10-01 ENCOUNTER — Ambulatory Visit: Admitting: Obstetrics

## 2023-10-01 VITALS — BP 118/83 | HR 96 | Wt 115.0 lb

## 2023-10-01 DIAGNOSIS — E739 Lactose intolerance, unspecified: Secondary | ICD-10-CM | POA: Diagnosis not present

## 2023-10-01 DIAGNOSIS — R636 Underweight: Secondary | ICD-10-CM | POA: Diagnosis not present

## 2023-10-01 DIAGNOSIS — Z3480 Encounter for supervision of other normal pregnancy, unspecified trimester: Secondary | ICD-10-CM

## 2023-10-01 DIAGNOSIS — O0990 Supervision of high risk pregnancy, unspecified, unspecified trimester: Secondary | ICD-10-CM | POA: Diagnosis present

## 2023-10-01 DIAGNOSIS — Z3A12 12 weeks gestation of pregnancy: Secondary | ICD-10-CM

## 2023-10-01 DIAGNOSIS — O09521 Supervision of elderly multigravida, first trimester: Secondary | ICD-10-CM | POA: Diagnosis not present

## 2023-10-01 MED ORDER — PRENATE MINI 18-0.6-0.4-350 MG PO CAPS
1.0000 | ORAL_CAPSULE | Freq: Every day | ORAL | 4 refills | Status: AC
Start: 2023-10-01 — End: ?

## 2023-10-01 MED ORDER — VITAFOL GUMMIES 3.33-0.333-34.8 MG PO CHEW: 1.0000 | CHEWABLE_TABLET | Freq: Every day | ORAL | 5 refills | Status: DC

## 2023-10-01 NOTE — Progress Notes (Signed)
 Pt presents for NOB. Pt unable to tolerate dairy products, makes her very sick and has pain

## 2023-10-01 NOTE — Progress Notes (Signed)
 Subjective:    Donna Lowery is being seen today for her first obstetrical visit.  This is not a planned pregnancy. She is at [redacted]w[redacted]d gestation. Her obstetrical history is significant for advanced maternal age and undferweight . Relationship with FOB: significant other, living together. Patient does intend to breast feed. Pregnancy history fully reviewed.  The information documented in the HPI was reviewed and verified.  Menstrual History: OB History     Gravida  5   Para  2   Term  2   Preterm  0   AB  2   Living  2      SAB  1   IAB  1   Ectopic  0   Multiple  0   Live Births  2            Patient's last menstrual period was 07/08/2023 (approximate).    Past Medical History:  Diagnosis Date   Anxiety     Past Surgical History:  Procedure Laterality Date   BREAST ENHANCEMENT SURGERY Bilateral 01/26/2020    (Not in a hospital admission)  No Known Allergies  Social History   Tobacco Use   Smoking status: Every Day    Current packs/day: 0.25    Average packs/day: 0.7 packs/day for 10.3 years (7.6 ttl pk-yrs)    Types: Cigarettes    Start date: 06/2010    Last attempt to quit: 06/2020   Smokeless tobacco: Never   Tobacco comments:    gave her the [employer] literature  Substance Use Topics   Alcohol use: Not Currently    Comment: occasional    Family History  Problem Relation Age of Onset   Healthy Mother    Diabetes Maternal Grandmother      Review of Systems Constitutional: negative for weight loss Gastrointestinal: negative for vomiting Genitourinary:negative for genital lesions and vaginal discharge and dysuria Musculoskeletal:negative for back pain Behavioral/Psych: negative for abusive relationship, depression, illegal drug usage and tobacco use    Objective:    BP 118/83   Pulse 96   Wt 115 lb (52.2 kg)   LMP 07/08/2023 (Approximate)   BMI 18.01 kg/m  General Appearance:    Alert, cooperative, no distress, appears stated age   Head:    Normocephalic, without obvious abnormality, atraumatic  Eyes:    PERRL, conjunctiva/corneas clear, EOM's intact, fundi    benign, both eyes  Ears:    Normal TM's and external ear canals, both ears  Nose:   Nares normal, septum midline, mucosa normal, no drainage    or sinus tenderness  Throat:   Lips, mucosa, and tongue normal; teeth and gums normal  Neck:   Supple, symmetrical, trachea midline, no adenopathy;    thyroid:  no enlargement/tenderness/nodules; no carotid   bruit or JVD  Back:     Symmetric, no curvature, ROM normal, no CVA tenderness  Lungs:     Clear to auscultation bilaterally, respirations unlabored  Chest Wall:    No tenderness or deformity   Heart:    Regular rate and rhythm, S1 and S2 normal, no murmur, rub   or gallop  Breast Exam:    No tenderness, masses, or nipple abnormality  Abdomen:     Soft, non-tender, bowel sounds active all four quadrants,    no masses, no organomegaly  Genitalia:    Normal female without lesion, discharge or tenderness  Extremities:   Extremities normal, atraumatic, no cyanosis or edema  Pulses:   2+ and symmetric all  extremities  Skin:   Skin color, texture, turgor normal, no rashes or lesions  Lymph nodes:   Cervical, supraclavicular, and axillary nodes normal  Neurologic:   CNII-XII intact, normal strength, sensation and reflexes    throughout      Lab Review Urine pregnancy test Labs reviewed yes Radiologic studies reviewed no  Assessment:    Pregnancy at [redacted]w[redacted]d weeks    Plan:    1. Supervision of high risk pregnancy, antepartum (Primary) Rx: - Cervicovaginal ancillary only( [place]) - Cytology - PAP( [place]) - CBC/D/Plt+RPR+Rh+ABO+RubIgG... - PANORAMA PRENATAL TEST - HORIZON Basic Panel - Ferritin - TSH - Prenat-FeCbn-FeAsp-Meth-FA-DHA (PRENATE MINI ) 18-0.6-0.4-350 MG CAPS; Take 1 capsule by mouth daily before breakfast.  Dispense: 90 capsule; Refill: 4  2. Multigravida of advanced maternal age in  first trimester  3. Underweight due to inadequate caloric intake  4. Late-onset lactase deficiency - Lactaid recommended with dairy consumption    Prenatal vitamins.  Counseling provided regarding continued use of seat belts, cessation of alcohol consumption, smoking or use of illicit drugs; infection precautions i.e., influenza/TDAP immunizations, toxoplasmosis,CMV, parvovirus, listeria and varicella; workplace safety, exercise during pregnancy; routine dental care, safe medications, sexual activity, hot tubs, saunas, pools, travel, caffeine use, fish and methlymercury, potential toxins, hair treatments, varicose veins Weight gain recommendations per IOM guidelines reviewed: underweight/BMI< 18.5--> gain 28 - 40 lbs; normal weight/BMI 18.5 - 24.9--> gain 25 - 35 lbs; overweight/BMI 25 - 29.9--> gain 15 - 25 lbs; obese/BMI >30->gain  11 - 20 lbs Problem list reviewed and updated. FIRST/CF mutation testing/NIPT/QUAD SCREEN/fragile X/Ashkenazi Jewish population testing/Spinal muscular atrophy discussed: requested. Role of ultrasound in pregnancy discussed; fetal survey: requested. Amniocentesis discussed: declined.  Meds ordered this encounter  Medications   DISCONTD: Prenatal Vit-Fe Phos-FA-Omega (VITAFOL  GUMMIES) 3.33-0.333-34.8 MG CHEW    Sig: Chew 1 tablet by mouth daily.    Dispense:  90 tablet    Refill:  5   Prenat-FeCbn-FeAsp-Meth-FA-DHA (PRENATE MINI ) 18-0.6-0.4-350 MG CAPS    Sig: Take 1 capsule by mouth daily before breakfast.    Dispense:  90 capsule    Refill:  4   Orders Placed This Encounter  Procedures   CBC/D/Plt+RPR+Rh+ABO+RubIgG...   [facility] PRENATAL TEST    ==========Department Information========== ID: [id number] Department:[facility]ENTER FOR [facility]ALTHC[facility] AT [facility] [address], [address] [place] [address] Dept: [phone] Dept Fax: [phone]     Method/type of collection::   Clinic to manage  sample collection    Expected due date (MM/DD/YYYY)::   04/13/2024    Is this a twin pregnancy? (viable, no vanished twin):   Not Twin Pregnancy, [name]    Is this a surrogate or egg donor pregnancy?:   No    I want fetal sex included in the report::   Yes    Maternal Weight (lbs)::   115    Which Microdeletion Panel should be ordered?:   22q11.2 Deletion    What type of billing?:   Automatic Data    By placing this electronic order I confirm the testing ordered herein is medically necessary and this patient has been informed of the details of the genetic test(s) ordered, including the risks, benefits, and alternatives, and has consented to testing.:   Yes    Select an order diagnosis: For additional options refer to [email or web address].[email or web address]:   Encounter for supervision of other normal pregnancy in second trimester [  1610960]   HORIZON Basic Panel    ==========Department Information========== ID: [id number] Department:[facility]ENTER FOR [facility] HEALTHCARE AT [facility] [address] [place] [address] Dept: [phone] Dept Fax: [phone]     Specify the name or ID of a valid Horizon Custom Panel::   HBASIC    Is patient pregnant?:   Yes    Ethnicity of patient::   African American    Practice ensures that HIPAA consent is obtained and will make available to [facility] upon request?:   Yes    By placing this electronic order I confirm the testing ordered herein is medically necessary and this patient has been informed of the details of the genetic test(s) ordered, including the risks, benefits, and alternatives, and has consented to testing.:   Yes    What type of billing?:   [employer] an order diagnosis: For additional options refer to http://garza.[email or web address]:   Encounter for supervision of other normal pregnancy in second trimester [1324401]    Tay-Sachs add-on  test?:   No   Ferritin   TSH    Follow up in 4 weeks.  I have spent a total of 20 minutes of face-to-face time, excluding clinical staff time, reviewing notes and preparing to see patient, ordering tests and/or medications, and counseling the patient.   Gabrielle Joiner, MD, FACOG Attending Obstetrician & Gynecologist, [facility] for [facility], [facility], [place] 10/01/2023

## 2023-10-02 LAB — CERVICOVAGINAL ANCILLARY ONLY
Bacterial Vaginitis (gardnerella): NEGATIVE
Candida Glabrata: NEGATIVE
Candida Vaginitis: POSITIVE — AB
Chlamydia: NEGATIVE
Comment: NEGATIVE
Comment: NEGATIVE
Comment: NEGATIVE
Comment: NEGATIVE
Comment: NEGATIVE
Comment: NORMAL
Neisseria Gonorrhea: NEGATIVE
Trichomonas: NEGATIVE

## 2023-10-03 LAB — CBC/D/PLT+RPR+RH+ABO+RUBIGG...
Antibody Screen: NEGATIVE
Basophils Absolute: 0 10*3/uL (ref 0.0–0.2)
Basos: 0 %
EOS (ABSOLUTE): 0.2 10*3/uL (ref 0.0–0.4)
Eos: 2 %
HCV Ab: NONREACTIVE
HIV Screen 4th Generation wRfx: NONREACTIVE
Hematocrit: 38.4 % (ref 34.0–46.6)
Hemoglobin: 12.5 g/dL (ref 11.1–15.9)
Hepatitis B Surface Ag: NEGATIVE
Immature Grans (Abs): 0 10*3/uL (ref 0.0–0.1)
Immature Granulocytes: 0 %
Lymphocytes Absolute: 1.8 10*3/uL (ref 0.7–3.1)
Lymphs: 23 %
MCH: 30.8 pg (ref 26.6–33.0)
MCHC: 32.6 g/dL (ref 31.5–35.7)
MCV: 95 fL (ref 79–97)
Monocytes Absolute: 0.5 10*3/uL (ref 0.1–0.9)
Monocytes: 7 %
Neutrophils Absolute: 5.6 10*3/uL (ref 1.4–7.0)
Neutrophils: 68 %
Platelets: 189 10*3/uL (ref 150–450)
RBC: 4.06 x10E6/uL (ref 3.77–5.28)
RDW: 11.8 % (ref 11.7–15.4)
RPR Ser Ql: NONREACTIVE
Rh Factor: POSITIVE
Rubella Antibodies, IGG: 4.15 {index} (ref >0.99)
WBC: 8.2 10*3/uL (ref 3.4–10.8)

## 2023-10-03 LAB — HCV INTERPRETATION

## 2023-10-04 LAB — CYTOLOGY - PAP

## 2023-10-06 LAB — PANORAMA PRENATAL TEST FULL PANEL:PANORAMA TEST PLUS 5 ADDITIONAL MICRODELETIONS: FETAL FRACTION: 14.1

## 2023-10-07 ENCOUNTER — Encounter: Payer: Self-pay | Admitting: Family Medicine

## 2023-10-08 ENCOUNTER — Telehealth: Payer: Self-pay | Admitting: *Deleted

## 2023-10-08 NOTE — Telephone Encounter (Signed)
 Error

## 2023-10-08 NOTE — Progress Notes (Signed)
 TC to review results and recommendations. No answer. Left HIPAA compliant VM and call back number.

## 2023-10-09 LAB — HORIZON CUSTOM: REPORT SUMMARY: POSITIVE — AB

## 2023-10-10 ENCOUNTER — Other Ambulatory Visit: Payer: Self-pay

## 2023-10-10 ENCOUNTER — Encounter: Payer: Self-pay | Admitting: Family Medicine

## 2023-10-10 DIAGNOSIS — R898 Other abnormal findings in specimens from other organs, systems and tissues: Secondary | ICD-10-CM

## 2023-10-10 DIAGNOSIS — Z148 Genetic carrier of other disease: Secondary | ICD-10-CM | POA: Insufficient documentation

## 2023-10-10 DIAGNOSIS — O285 Abnormal chromosomal and genetic finding on antenatal screening of mother: Secondary | ICD-10-CM

## 2023-10-10 LAB — SPECIMEN STATUS REPORT

## 2023-10-10 LAB — FERRITIN: Ferritin: 127 ng/mL (ref 15–150)

## 2023-10-10 LAB — TSH: TSH: 1.39 u[IU]/mL (ref 0.450–4.500)

## 2023-10-10 NOTE — Progress Notes (Signed)
 error

## 2023-10-10 NOTE — Progress Notes (Signed)
Referral to genetics placed.

## 2023-10-29 ENCOUNTER — Ambulatory Visit (INDEPENDENT_AMBULATORY_CARE_PROVIDER_SITE_OTHER): Admitting: Obstetrics and Gynecology

## 2023-10-29 VITALS — BP 98/62 | HR 89 | Wt 122.0 lb

## 2023-10-29 DIAGNOSIS — O0990 Supervision of high risk pregnancy, unspecified, unspecified trimester: Secondary | ICD-10-CM

## 2023-10-29 DIAGNOSIS — Z3A16 16 weeks gestation of pregnancy: Secondary | ICD-10-CM

## 2023-10-29 DIAGNOSIS — Z3480 Encounter for supervision of other normal pregnancy, unspecified trimester: Secondary | ICD-10-CM | POA: Diagnosis not present

## 2023-10-29 DIAGNOSIS — O09523 Supervision of elderly multigravida, third trimester: Secondary | ICD-10-CM | POA: Insufficient documentation

## 2023-10-29 DIAGNOSIS — O09522 Supervision of elderly multigravida, second trimester: Secondary | ICD-10-CM | POA: Insufficient documentation

## 2023-10-29 DIAGNOSIS — Z148 Genetic carrier of other disease: Secondary | ICD-10-CM

## 2023-10-29 DIAGNOSIS — R87613 High grade squamous intraepithelial lesion on cytologic smear of cervix (HGSIL): Secondary | ICD-10-CM

## 2023-10-29 NOTE — Progress Notes (Signed)
   PRENATAL VISIT NOTE  Subjective:  Donna Lowery is a 38 y.o. [id number] at [redacted]w[redacted]d being seen today for ongoing prenatal care.  She is currently monitored for the following issues for this low-risk pregnancy and has BMI less than 19,adult; HSIL on Pap smear of cervix; Supervision of other normal pregnancy, antepartum; and Carrier of spinal muscular atrophy on their problem list.  Patient reports no complaints.  Contractions: Not present. Vag. Bleeding: None.  Movement: Present. Denies leaking of fluid.   The following portions of the patient's history were reviewed and updated as appropriate: allergies, current medications, past family history, past medical history, past social history, past surgical history and problem list.   Objective:   Vitals:   10/29/23 1118  BP: 98/62  Pulse: 89  Weight: 122 lb (55.3 kg)    Fetal Status: Fetal Heart Rate (bpm): 159   Movement: Present     General:  Alert, oriented and cooperative. Patient is in no acute distress.  Skin: Skin is warm and dry. No rash noted.   Cardiovascular: Normal heart rate noted  Respiratory: Normal respiratory effort, no problems with respiration noted  Abdomen: Soft, gravid, appropriate for gestational age.  Pain/Pressure: Absent      Assessment and Plan:  Pregnancy: [id number] at [redacted]w[redacted]d 1. Supervision of high risk pregnancy, antepartum (Primary) 2. [redacted] weeks gestation of pregnancy - AFP, Serum, Open Spina Bifida Anatomy US  6/10  4. HSIL on Pap smear of cervix PP colpo (see problem list for dysplasia hx)  5. Carrier of spinal muscular atrophy Reviewed with patient & her partner today Partner test information given  6. AMA LR NIPS  Please refer to After Visit Summary for other counseling recommendations.   Return in about 4 weeks (around 11/26/2023) for return OB at 20 weeks.  Future Appointments  Date Time Provider Department Center  11/19/2023  8:00 AM [facility] PROVIDER 1 [facility]  11/19/2023  8:30 AM [facility]  US5 [facility]  11/26/2023 10:55 AM [facility], Darren Em, CNM [facility] None    Izell Marsh, MD

## 2023-10-31 ENCOUNTER — Encounter: Payer: Self-pay | Admitting: Obstetrics

## 2023-10-31 LAB — AFP, SERUM, OPEN SPINA BIFIDA
AFP MoM: 1.28
AFP Value: 54.2 ng/mL
Gest. Age on Collection Date: 16.1 wk
Maternal Age At EDD: 36.2 a
OSBR Risk 1 IN: 10000
Test Results:: NEGATIVE
Weight: 122 [lb_av]

## 2023-11-01 ENCOUNTER — Other Ambulatory Visit: Payer: Self-pay

## 2023-11-01 DIAGNOSIS — B379 Candidiasis, unspecified: Secondary | ICD-10-CM

## 2023-11-01 MED ORDER — TERCONAZOLE 0.8 % VA CREA: 1.0000 | TOPICAL_CREAM | Freq: Every day | VAGINAL | 0 refills | Status: DC

## 2023-11-05 ENCOUNTER — Ambulatory Visit: Payer: Self-pay | Admitting: Obstetrics and Gynecology

## 2023-11-05 DIAGNOSIS — Z3480 Encounter for supervision of other normal pregnancy, unspecified trimester: Secondary | ICD-10-CM

## 2023-11-06 ENCOUNTER — Encounter: Payer: Self-pay | Admitting: Obstetrics and Gynecology

## 2023-11-19 ENCOUNTER — Other Ambulatory Visit: Payer: Self-pay | Admitting: *Deleted

## 2023-11-19 ENCOUNTER — Ambulatory Visit (HOSPITAL_BASED_OUTPATIENT_CLINIC_OR_DEPARTMENT_OTHER): Admitting: Obstetrics

## 2023-11-19 ENCOUNTER — Ambulatory Visit: Attending: Obstetrics & Gynecology

## 2023-11-19 VITALS — BP 114/66 | HR 98

## 2023-11-19 DIAGNOSIS — Z3A19 19 weeks gestation of pregnancy: Secondary | ICD-10-CM | POA: Insufficient documentation

## 2023-11-19 DIAGNOSIS — Z148 Genetic carrier of other disease: Secondary | ICD-10-CM | POA: Insufficient documentation

## 2023-11-19 DIAGNOSIS — O09512 Supervision of elderly primigravida, second trimester: Secondary | ICD-10-CM | POA: Insufficient documentation

## 2023-11-19 DIAGNOSIS — O289 Unspecified abnormal findings on antenatal screening of mother: Secondary | ICD-10-CM | POA: Insufficient documentation

## 2023-11-19 DIAGNOSIS — Z3480 Encounter for supervision of other normal pregnancy, unspecified trimester: Secondary | ICD-10-CM | POA: Insufficient documentation

## 2023-11-19 DIAGNOSIS — O99332 Smoking (tobacco) complicating pregnancy, second trimester: Secondary | ICD-10-CM | POA: Insufficient documentation

## 2023-11-19 DIAGNOSIS — O09522 Supervision of elderly multigravida, second trimester: Secondary | ICD-10-CM

## 2023-11-19 DIAGNOSIS — O280 Abnormal hematological finding on antenatal screening of mother: Secondary | ICD-10-CM | POA: Diagnosis not present

## 2023-11-19 DIAGNOSIS — Z363 Encounter for antenatal screening for malformations: Secondary | ICD-10-CM | POA: Diagnosis not present

## 2023-11-19 NOTE — Progress Notes (Signed)
 MFM Consult Note  Donna Lowery is currently at 19 weeks and 1 day.  She was seen due to advanced maternal age (38 years old at the time of delivery).  Her Horizon screening test indicated that she is at increased carrier risk for spinal muscular atrophy (SMA).  Her Panorama cell free DNA test earlier in her pregnancy which indicated a low risk for trisomy 51, 3, and 13. A female fetus is predicted.   She denies any significant past medical history and denies any problems in her current pregnancy.    She was informed that the fetal growth measures large for her gestational age (96%).  There was normal amniotic fluid noted.  There were no obvious fetal anomalies noted on today's ultrasound exam.  However, today's exam was limited due to the fetal position.  The patient was informed that anomalies may be missed due to technical limitations. If the fetus is in a suboptimal position or maternal habitus is increased, visualization of the fetus in the maternal uterus may be impaired.  The following were discussed during today's consultation:  Advanced maternal age in pregnancy  The increased risk of fetal aneuploidy due to advanced maternal age was discussed.   Due to advanced maternal age, the patient was offered and declined an amniocentesis today for definitive diagnosis of fetal aneuploidy.  She is comfortable with the low risk indicated by her cell free DNA test.  Increased carrier risk for spinal muscular atrophy (SMA)  Spinal muscular atrophy is a genetic disorder that causes progressive muscle weakness and wasting due to the loss of motor neurons in the spinal cord.    The patient was advised that SMA is inherited in an autosomal recessive fashion, meaning that the child needs to inherit 2 copies of the mutated gene, one from each parent, to develop the condition.   She was advised to have her partner tested to determine if he is also a carrier for this condition.    Should he  screen negative for SMA, their child should not be at risk for inheriting this condition.  Should both parents be carriers for this condition, their child may have a 25% chance of inheriting the disease.    The patient reports that she will most likely have her partner get a screening test next week.  Should both parents be identified as carriers for this condition, she understands that an amniocentesis is available for definitive prenatal diagnosis.   A follow-up exam was scheduled in 5 weeks to assess the fetal growth and to complete the views of the fetal anatomy.    The patient stated that all of her questions were answered today.  A total of 30 minutes was spent counseling and coordinating the care for this patient.  Greater than 50% of the time was spent in direct face-to-face contact.

## 2023-11-26 ENCOUNTER — Ambulatory Visit (INDEPENDENT_AMBULATORY_CARE_PROVIDER_SITE_OTHER): Admitting: Advanced Practice Midwife

## 2023-11-26 VITALS — BP 115/70 | HR 92 | Wt 129.0 lb

## 2023-11-26 DIAGNOSIS — Z3480 Encounter for supervision of other normal pregnancy, unspecified trimester: Secondary | ICD-10-CM

## 2023-11-26 DIAGNOSIS — R87613 High grade squamous intraepithelial lesion on cytologic smear of cervix (HGSIL): Secondary | ICD-10-CM | POA: Diagnosis not present

## 2023-11-26 DIAGNOSIS — O09522 Supervision of elderly multigravida, second trimester: Secondary | ICD-10-CM

## 2023-11-26 DIAGNOSIS — Z3A20 20 weeks gestation of pregnancy: Secondary | ICD-10-CM

## 2023-11-26 MED ORDER — ONDANSETRON HCL 4 MG PO TABS: 4.0000 mg | ORAL_TABLET | Freq: Three times a day (TID) | ORAL | 0 refills | Status: DC | PRN

## 2023-11-26 NOTE — Progress Notes (Signed)
   PRENATAL VISIT NOTE  Subjective:  Donna Lowery is a 38 y.o. [id number] at [redacted]w[redacted]d being seen today for ongoing prenatal care.  She is currently monitored for the following issues for this low-risk pregnancy and has BMI less than 19,adult; HSIL on Pap smear of cervix; Supervision of other normal pregnancy, antepartum; Carrier of spinal muscular atrophy; and Multigravida of advanced maternal age in second trimester on their problem list.  Patient reports no complaints.  Contractions: Not present. Vag. Bleeding: None.  Movement: Present. Denies leaking of fluid.   The following portions of the patient's history were reviewed and updated as appropriate: allergies, current medications, past family history, past medical history, past social history, past surgical history and problem list.   Objective:    Vitals:   11/26/23 1043  BP: 115/70  Pulse: 92  Weight: 129 lb (58.5 kg)    Fetal Status:  Fetal Heart Rate (bpm): 149   Movement: Present    General: Alert, oriented and cooperative. Patient is in no acute distress.  Skin: Skin is warm and dry. No rash noted.   Cardiovascular: Normal heart rate noted  Respiratory: Normal respiratory effort, no problems with respiration noted  Abdomen: Soft, gravid, appropriate for gestational age.  Pain/Pressure: Present     Pelvic: Cervical exam deferred        Extremities: Normal range of motion.  Edema: None  Mental Status: Normal mood and affect. Normal behavior. Normal judgment and thought content.   Assessment and Plan:  Pregnancy: [id number] at [redacted]w[redacted]d 1. Supervision of other normal pregnancy, antepartum (Primary) --Anticipatory guidance about next visits/weeks of pregnancy given.  --Pt concerned about recent US  and EFW 97%.  Discussed, pt at low risk for GDM, routine testing at 28 weeks scheduled.  Reassurance provided, will continue to monitor.  F/U US  in 5 weeks. --Pt had nausea with GTT in the past, Rx for Zofran for 28 week testing only, no  other nausea this pregnancy.  2. HSIL on Pap smear of cervix --Colpo PP  3. Multigravida of advanced maternal age in second trimester   4. [redacted] weeks gestation of pregnancy   Preterm labor symptoms and general obstetric precautions including but not limited to vaginal bleeding, contractions, leaking of fluid and fetal movement were reviewed in detail with the patient. Please refer to After Visit Summary for other counseling recommendations.   Return in about 4 weeks (around 12/24/2023) for LOB.  Future Appointments  Date Time Provider Department Center  12/24/2023 10:00 AM [facility] PROVIDER 1 [facility]  12/24/2023 10:30 AM [facility] US2 [facility]  12/24/2023  1:10 PM Leftwich-Kirby, Darren Em, CNM [facility] None    Arlester Bence, CNM

## 2023-11-26 NOTE — Progress Notes (Signed)
 Pt presents for rob. Pt has no questions or concerns at this time.

## 2023-12-24 ENCOUNTER — Ambulatory Visit (HOSPITAL_BASED_OUTPATIENT_CLINIC_OR_DEPARTMENT_OTHER)

## 2023-12-24 ENCOUNTER — Ambulatory Visit (INDEPENDENT_AMBULATORY_CARE_PROVIDER_SITE_OTHER): Admitting: Advanced Practice Midwife

## 2023-12-24 ENCOUNTER — Ambulatory Visit: Attending: Obstetrics | Admitting: Maternal & Fetal Medicine

## 2023-12-24 ENCOUNTER — Encounter: Payer: Self-pay | Admitting: Advanced Practice Midwife

## 2023-12-24 VITALS — BP 109/66

## 2023-12-24 VITALS — BP 108/64 | HR 94 | Wt 136.2 lb

## 2023-12-24 DIAGNOSIS — Z362 Encounter for other antenatal screening follow-up: Secondary | ICD-10-CM | POA: Insufficient documentation

## 2023-12-24 DIAGNOSIS — Z681 Body mass index (BMI) 19 or less, adult: Secondary | ICD-10-CM

## 2023-12-24 DIAGNOSIS — O09522 Supervision of elderly multigravida, second trimester: Secondary | ICD-10-CM | POA: Diagnosis not present

## 2023-12-24 DIAGNOSIS — Z3A24 24 weeks gestation of pregnancy: Secondary | ICD-10-CM | POA: Insufficient documentation

## 2023-12-24 DIAGNOSIS — Z3480 Encounter for supervision of other normal pregnancy, unspecified trimester: Secondary | ICD-10-CM

## 2023-12-24 DIAGNOSIS — Z148 Genetic carrier of other disease: Secondary | ICD-10-CM

## 2023-12-24 DIAGNOSIS — O99332 Smoking (tobacco) complicating pregnancy, second trimester: Secondary | ICD-10-CM | POA: Insufficient documentation

## 2023-12-24 DIAGNOSIS — R140 Abdominal distension (gaseous): Secondary | ICD-10-CM

## 2023-12-24 NOTE — Progress Notes (Signed)
   Patient information  Patient Name: Donna Lowery  Patient MRN:   [id number]  Referring practice: MFM Referring Provider: [facility])  Problem List   Patient Active Problem List   Diagnosis Date Noted   Multigravida of advanced maternal age in second trimester 10/29/2023   Carrier of spinal muscular atrophy 10/10/2023   Supervision of other normal pregnancy, antepartum 09/04/2023   HSIL on Pap smear of cervix 01/18/2021   BMI less than 19,adult 11/12/2019   Maternal Fetal medicine Consult  Donna Lowery is a 38 y.o. [id number] at [redacted]w[redacted]d here for ultrasound and consultation. Donna Lowery is doing well today with no acute concerns. Today we focused on the following:    The patient was here for a follow-up growth ultrasound to clear the fetal anatomy.  I discussed that the fetal structures appear normal today and no further ultrasounds are required at this time based on the current indications.  Since she is not over age 70 growth ultrasounds are not likely to provide further benefit.  The patient had time to ask questions that were answered to her satisfaction.  She verbalized understanding and agrees to proceed with the plan below.  Sonographic findings Single intrauterine pregnancy at 24w 1d.  Fetal cardiac activity:  Observed and appears normal. Presentation: Breech. Interval fetal anatomy appears normal. Fetal biometry shows the estimated fetal weight at the 72 percentile. Amniotic fluid volume: Within normal limits. MVP: 6.25 cm. Placenta: Anterior.  There are limitations of prenatal ultrasound such as the inability to detect certain abnormalities due to poor visualization. Various factors such as fetal position, gestational age and maternal body habitus may increase the difficulty in visualizing the fetal anatomy.    Recommendations - No further ultrasounds are recommended at this time based on the current indications. If future indications  arise (e.g. size/date discrepancy on fundal height, gestational diabetes or hypertension) and an ultrasound is to be desired at our MFM office, please send a referral.   Review of Systems: A review of systems was performed and was negative except per HPI   Vitals and Physical Exam    12/24/2023   10:16 AM 11/26/2023   10:43 AM 11/19/2023    7:57 AM  Vitals with BMI  Weight  129 lbs   Systolic 109 115 885  Diastolic 66 70 66  Pulse  92 98    Sitting comfortably on the sonogram table Nonlabored breathing Normal rate and rhythm Abdomen is nontender  Past pregnancies OB History  Gravida Para Term Preterm AB Living  5 2 2  0 2 2  SAB IAB Ectopic Multiple Live Births  1 1 0 0 2    # Outcome Date GA Lbr Len/2nd Weight Sex Type Anes PTL Lv  5 Current           4 SAB 10/10/19          3 IAB 2012          2 Term 05/20/10    M Vag-Spont  N LIV  1 Term 01/27/07    M Vag-Spont  N LIV     I spent 10 minutes reviewing the patients chart, including labs and images as well as counseling the patient about her medical conditions. Greater than 50% of the time was spent in direct face-to-face patient counseling.  Donna Lowery  MFM, [facility]   12/24/2023  11:56 AM

## 2023-12-24 NOTE — Progress Notes (Signed)
   PRENATAL VISIT NOTE  Subjective:  Donna Lowery is a 38 y.o. [id number] at [redacted]w[redacted]d being seen today for ongoing prenatal care.  She is currently monitored for the following issues for this low-risk pregnancy and has BMI less than 19,adult; HSIL on Pap smear of cervix; Supervision of other normal pregnancy, antepartum; Carrier of spinal muscular atrophy; and Multigravida of advanced maternal age in second trimester on their problem list.  Patient reports no complaints.  Contractions: Irritability. Vag. Bleeding: None.  Movement: Increased. Denies leaking of fluid.   The following portions of the patient's history were reviewed and updated as appropriate: allergies, current medications, past family history, past medical history, past social history, past surgical history and problem list.   Objective:    Vitals:   12/24/23 1314  BP: 108/64  Pulse: 94  Weight: 136 lb 3.2 oz (61.8 kg)    Fetal Status:  Fetal Heart Rate (bpm): 141 Fundal Height: 24 cm Movement: Increased    General: Alert, oriented and cooperative. Patient is in no acute distress.  Skin: Skin is warm and dry. No rash noted.   Cardiovascular: Normal heart rate noted  Respiratory: Normal respiratory effort, no problems with respiration noted  Abdomen: Soft, gravid, appropriate for gestational age.  Pain/Pressure: Present     Pelvic: Cervical exam deferred        Extremities: Normal range of motion.  Edema: None  Mental Status: Normal mood and affect. Normal behavior. Normal judgment and thought content.   Assessment and Plan:  Pregnancy: [id number] at [redacted]w[redacted]d 1. Supervision of other normal pregnancy, antepartum (Primary) Anticipatory guidance about next visits/weeks of pregnancy given.  GTT at next visit  2. Carrier of spinal muscular atrophy   3. Multigravida of advanced maternal age in second trimester Normal NIPS  4. [redacted] weeks gestation of pregnancy   Preterm labor symptoms and general obstetric precautions  including but not limited to vaginal bleeding, contractions, leaking of fluid and fetal movement were reviewed in detail with the patient. Please refer to After Visit Summary for other counseling recommendations.   Return in about 2 weeks (around 01/07/2024) for Midwife preferred, GTT at next visit.  No future appointments.  Olam Boards, CNM

## 2023-12-24 NOTE — Progress Notes (Signed)
 Wants to discuss a 5D ultrasound.   Wants to discuss birth plan and get info on water birth.   No other concerns at this time. Had anatomy ultrasound this morning at MFM.

## 2024-01-21 ENCOUNTER — Ambulatory Visit: Payer: Self-pay | Admitting: Advanced Practice Midwife

## 2024-01-21 ENCOUNTER — Other Ambulatory Visit

## 2024-01-21 ENCOUNTER — Encounter: Payer: Self-pay | Admitting: Advanced Practice Midwife

## 2024-01-21 VITALS — BP 115/68 | HR 94 | Wt 141.6 lb

## 2024-01-21 DIAGNOSIS — Z23 Encounter for immunization: Secondary | ICD-10-CM | POA: Diagnosis not present

## 2024-01-21 DIAGNOSIS — Z3A28 28 weeks gestation of pregnancy: Secondary | ICD-10-CM | POA: Diagnosis not present

## 2024-01-21 DIAGNOSIS — Z3480 Encounter for supervision of other normal pregnancy, unspecified trimester: Secondary | ICD-10-CM

## 2024-01-21 DIAGNOSIS — Z3483 Encounter for supervision of other normal pregnancy, third trimester: Secondary | ICD-10-CM | POA: Diagnosis not present

## 2024-01-21 NOTE — Progress Notes (Addendum)
   PRENATAL VISIT NOTE  Subjective:  Donna Lowery is a 38 y.o. [id number] at [redacted]w[redacted]d being seen today for ongoing prenatal care.  She is currently monitored for the following issues for this low-risk pregnancy and has BMI less than 19,adult; HSIL on Pap smear of cervix; Supervision of other normal pregnancy, antepartum; Carrier of spinal muscular atrophy; and Multigravida of advanced maternal age in second trimester on their problem list.  Patient reports no complaints.  Contractions: Irritability. Vag. Bleeding: None.  Movement: Present. Denies leaking of fluid.   The following portions of the patient's history were reviewed and updated as appropriate: allergies, current medications, past family history, past medical history, past social history, past surgical history and problem list.   Objective:    Vitals:   01/21/24 0909  BP: 115/68  Pulse: 94  Weight: 64.2 kg    Fetal Status:  Fetal Heart Rate (bpm): 145 Fundal Height: 28 cm Movement: Present    General: Alert, oriented and cooperative. Patient is in no acute distress.  Skin: Skin is warm and dry. No rash noted.   Cardiovascular: Normal heart rate noted  Respiratory: Normal respiratory effort, no problems with respiration noted  Abdomen: Soft, gravid, appropriate for gestational age.  Pain/Pressure: Present     Pelvic: Cervical exam deferred        Extremities: Normal range of motion.  Edema: None  Mental Status: Normal mood and affect. Normal behavior. Normal judgment and thought content.   Assessment and Plan:  Pregnancy: [id number] at [redacted]w[redacted]d 1. Supervision of other normal pregnancy, antepartum (Primary) BP and FHR normal today.  - Glucose Tolerance, 2 Hours w/1 Hour - HIV antibody (with reflex) - RPR - CBC  2. [redacted] weeks gestation of pregnancy  - Glucose Tolerance, 2 Hours w/1 Hour - HIV antibody (with reflex) - RPR - CBC  TDAP given today  Preterm labor symptoms and general obstetric precautions including but not  limited to vaginal bleeding, contractions, leaking of fluid and fetal movement were reviewed in detail with the patient. Please refer to After Visit Summary for other counseling recommendations.   Return in about 2 weeks (around 02/04/2024) for ROB.  No future appointments.   Derrek JINNY Freund, NP Student   Midwife Attestation:  I personally saw and evaluated the patient, performing the key elements of the service. I developed and verified the management plan that is described in the resident's/student's note, and I agree with the content with my edits above. VSS, HRR&R, Resp unlabored, Legs neg.    Olam Boards, CNM 1:54 PM

## 2024-01-21 NOTE — Progress Notes (Signed)
 Pt presents for ROB visit. Declines Tdap today

## 2024-01-22 LAB — CBC
Hematocrit: 34.1 % (ref 34.0–46.6)
Hemoglobin: 10.9 g/dL — ABNORMAL LOW (ref 11.1–15.9)
MCH: 31.2 pg (ref 26.6–33.0)
MCHC: 32 g/dL (ref 31.5–35.7)
MCV: 98 fL — ABNORMAL HIGH (ref 79–97)
Platelets: 202 x10E3/uL (ref 150–450)
RBC: 3.49 x10E6/uL — ABNORMAL LOW (ref 3.77–5.28)
RDW: 12 % (ref 11.7–15.4)
WBC: 9.5 x10E3/uL (ref 3.4–10.8)

## 2024-01-22 LAB — GLUCOSE TOLERANCE, 2 HOURS W/ 1HR
Glucose, 1 hour: 126 mg/dL (ref 70–179)
Glucose, 2 hour: 78 mg/dL (ref 70–152)
Glucose, Fasting: 82 mg/dL (ref 70–91)

## 2024-01-22 LAB — RPR: RPR Ser Ql: NONREACTIVE

## 2024-01-22 LAB — HIV ANTIBODY (ROUTINE TESTING W REFLEX): HIV Screen 4th Generation wRfx: NONREACTIVE

## 2024-01-27 ENCOUNTER — Ambulatory Visit: Payer: Self-pay | Admitting: Advanced Practice Midwife

## 2024-02-04 ENCOUNTER — Encounter: Payer: Self-pay | Admitting: Nurse Practitioner

## 2024-02-04 ENCOUNTER — Ambulatory Visit (INDEPENDENT_AMBULATORY_CARE_PROVIDER_SITE_OTHER): Admitting: Nurse Practitioner

## 2024-02-04 VITALS — BP 128/70 | HR 100 | Wt 140.9 lb

## 2024-02-04 DIAGNOSIS — R87613 High grade squamous intraepithelial lesion on cytologic smear of cervix (HGSIL): Secondary | ICD-10-CM

## 2024-02-04 DIAGNOSIS — O99013 Anemia complicating pregnancy, third trimester: Secondary | ICD-10-CM

## 2024-02-04 DIAGNOSIS — D509 Iron deficiency anemia, unspecified: Secondary | ICD-10-CM

## 2024-02-04 DIAGNOSIS — O09523 Supervision of elderly multigravida, third trimester: Secondary | ICD-10-CM | POA: Diagnosis not present

## 2024-02-04 DIAGNOSIS — Z3480 Encounter for supervision of other normal pregnancy, unspecified trimester: Secondary | ICD-10-CM

## 2024-02-04 DIAGNOSIS — Z3A30 30 weeks gestation of pregnancy: Secondary | ICD-10-CM | POA: Diagnosis not present

## 2024-02-04 DIAGNOSIS — O09521 Supervision of elderly multigravida, first trimester: Secondary | ICD-10-CM

## 2024-02-04 MED ORDER — FERROUS SULFATE 325 (65 FE) MG PO TABS: 325.0000 mg | ORAL_TABLET | Freq: Every day | ORAL | 0 refills | Status: DC

## 2024-02-04 NOTE — Progress Notes (Signed)
   PRENATAL VISIT NOTE  Subjective:  Donna Lowery is a 38 y.o. [id number] at [redacted]w[redacted]d being seen today for ongoing prenatal care.  She is currently monitored for the following issues for this low-risk pregnancy and has BMI less than 19,adult; HSIL on Pap smear of cervix; Supervision of other normal pregnancy, antepartum; Carrier of spinal muscular atrophy; and Multigravida of advanced maternal age in second trimester on their problem list.  Patient reports no complaints.  Contractions: Irritability. Vag. Bleeding: None.  Movement: Present. Denies leaking of fluid.   The following portions of the patient's history were reviewed and updated as appropriate: allergies, current medications, past family history, past medical history, past social history, past surgical history and problem list.   Objective:   Vitals:   02/04/24 1029  BP: 128/70  Pulse: 100  Weight: 140 lb 14.4 oz (63.9 kg)    Fetal Status: Fetal Heart Rate (bpm): 143   Movement: Present     General:  Alert, oriented and cooperative. Patient is in no acute distress.  Skin: Skin is warm and dry. No rash noted.   Cardiovascular: Normal heart rate noted  Respiratory: Normal respiratory effort, no problems with respiration noted  Abdomen: Soft, gravid, appropriate for gestational age.  Pain/Pressure: Present     Pelvic: Cervical exam deferred        Extremities: Normal range of motion.  Edema: None  Mental Status: Normal mood and affect. Normal behavior. Normal judgment and thought content.   Assessment and Plan:  Pregnancy: [id number] at [redacted]w[redacted]d  1. Iron deficiency anemia, unspecified iron deficiency anemia type (Primary) - Hgb 10.9 - Anemia panel - ferrous sulfate  325 (65 FE) MG tablet; Take 1 tablet (325 mg total) by mouth daily.  Dispense: 30 tablet; Refill: 0  2. Supervision of other normal pregnancy, antepartum -FHR and FH appropriate  3. [redacted] weeks gestation of pregnancy - Desires water birth  - Literature and  information on class provided  4. HSIL on Pap smear of cervix PP colpo for HSIL  5. Multigravida of advanced maternal age in first trimester - normal NIPS    There are no diagnoses linked to this encounter. Preterm labor symptoms and general obstetric precautions including but not limited to vaginal bleeding, contractions, leaking of fluid and fetal movement were reviewed in detail with the patient. Please refer to After Visit Summary for other counseling recommendations.   Return in about 2 weeks (around 02/18/2024) for LOB.  Future Appointments  Date Time Provider Department Center  02/19/2024 11:15 AM Dunn, Rollo DASEN, MD [facility] None    Olam DELENA Dalton, NP

## 2024-02-04 NOTE — Progress Notes (Signed)
 Pt presents for rob. Pt has no questions or concerns at this time.

## 2024-02-05 ENCOUNTER — Encounter: Payer: Self-pay | Admitting: Nurse Practitioner

## 2024-02-05 ENCOUNTER — Encounter: Payer: Self-pay | Admitting: Advanced Practice Midwife

## 2024-02-06 ENCOUNTER — Inpatient Hospital Stay (HOSPITAL_COMMUNITY)
Admission: RE | Admit: 2024-02-06 | Discharge: 2024-02-06 | Disposition: A | Discharge status: Home / Self Care | Attending: Obstetrics and Gynecology | Admitting: Obstetrics and Gynecology

## 2024-02-06 ENCOUNTER — Encounter (HOSPITAL_COMMUNITY): Payer: Self-pay | Admitting: Obstetrics and Gynecology

## 2024-02-06 ENCOUNTER — Other Ambulatory Visit: Payer: Self-pay

## 2024-02-06 DIAGNOSIS — B9689 Other specified bacterial agents as the cause of diseases classified elsewhere: Secondary | ICD-10-CM

## 2024-02-06 DIAGNOSIS — O26893 Other specified pregnancy related conditions, third trimester: Secondary | ICD-10-CM | POA: Diagnosis not present

## 2024-02-06 DIAGNOSIS — Z3A30 30 weeks gestation of pregnancy: Secondary | ICD-10-CM | POA: Diagnosis not present

## 2024-02-06 DIAGNOSIS — O23593 Infection of other part of genital tract in pregnancy, third trimester: Secondary | ICD-10-CM | POA: Diagnosis not present

## 2024-02-06 DIAGNOSIS — O09523 Supervision of elderly multigravida, third trimester: Secondary | ICD-10-CM | POA: Insufficient documentation

## 2024-02-06 DIAGNOSIS — R109 Unspecified abdominal pain: Secondary | ICD-10-CM | POA: Insufficient documentation

## 2024-02-06 DIAGNOSIS — N760 Acute vaginitis: Secondary | ICD-10-CM

## 2024-02-06 DIAGNOSIS — Z3689 Encounter for other specified antenatal screening: Secondary | ICD-10-CM | POA: Insufficient documentation

## 2024-02-06 LAB — WET PREP, GENITAL
Sperm: NONE SEEN
Trich, Wet Prep: NONE SEEN
WBC, Wet Prep HPF POC: >=10 — AB (ref <10)
Yeast Wet Prep HPF POC: NONE SEEN

## 2024-02-06 LAB — URINALYSIS, ROUTINE W REFLEX MICROSCOPIC
Bilirubin Urine: NEGATIVE
Glucose, UA: NEGATIVE mg/dL
Hgb urine dipstick: NEGATIVE
Ketones, ur: NEGATIVE mg/dL
Nitrite: NEGATIVE
Protein, ur: NEGATIVE mg/dL
Specific Gravity, Urine: 1.02 (ref 1.005–1.030)
pH: 6 (ref 5.0–8.0)

## 2024-02-06 LAB — ANEMIA PANEL
Ferritin: 11 ng/mL — ABNORMAL LOW (ref 15–150)
Folate, Hemolysate: 383 ng/mL
Folate, RBC: 1150 ng/mL (ref >498)
Hematocrit: 33.3 % — ABNORMAL LOW (ref 34.0–46.6)
Iron Saturation: 7 % — CL (ref 15–55)
Iron: 37 ug/dL (ref 27–159)
Retic Ct Pct: 1.3 % (ref 0.6–2.6)
Total Iron Binding Capacity: 550 ug/dL — ABNORMAL HIGH (ref 250–450)
UIBC: 513 ug/dL — ABNORMAL HIGH (ref 131–425)
Vitamin B-12: 321 pg/mL (ref 232–1245)

## 2024-02-06 MED ORDER — METRONIDAZOLE 0.75 % VA GEL
1.0000 | Freq: Every day | VAGINAL | 1 refills | Status: DC
Start: 2024-02-06 — End: 2024-03-04

## 2024-02-06 NOTE — MAU Provider Note (Signed)
 History     CSN: [id number]  Arrival date and time: 02/06/24 2046   Event Date/Time   First Provider Initiated Contact with Patient 02/06/24 2126      No chief complaint on file.  Donna Lowery is a 38 y.o. [id number] at [redacted]w[redacted]d who receives care at [facility].  She presents today for back and abdominal pain.  She reports abdominal tightness that has been present since 4pm.  She is a little concerned that it is contractions and also reports some pain on her sides.  She states it started in her lower back and worked it's way up on the sides.  She states it is on both sides, intermittent and started about 1800.  She states it is sharp pain with no relieving or worsening factors.  She rates the pain a 7/10. She states she initially thought it was gas pain.  Patient endorses fetal movement. No issues with urination, constipation, or diarrhea.    OB History     Gravida  5   Para  2   Term  2   Preterm  0   AB  2   Living  2      SAB  1   IAB  1   Ectopic  0   Multiple  0   Live Births  2           Past Medical History:  Diagnosis Date   Anxiety     Past Surgical History:  Procedure Laterality Date   BREAST ENHANCEMENT SURGERY Bilateral 01/26/2020   WISDOM TOOTH EXTRACTION Bilateral     Family History  Problem Relation Age of Onset   Healthy Mother    Asthma Sister    Hypertension Maternal Grandmother    Diabetes Maternal Grandmother    Cancer Neg Hx     Social History   Tobacco Use   Smoking status: Every Day    Current packs/day: 0.25    Average packs/day: 0.7 packs/day for 10.7 years (7.7 ttl pk-yrs)    Types: Cigarettes    Start date: 06/2010    Last attempt to quit: 06/2020   Smokeless tobacco: Never   Tobacco comments:    gave her the [employer] literature  Vaping Use   Vaping status: Never Used  Substance Use Topics   Alcohol use: Not Currently    Comment: occasional   Drug use: Never    Allergies: No Known  Allergies  Medications Prior to Admission  Medication Sig Dispense Refill Last Dose/Taking   Blood Pressure Monitoring (BLOOD PRESSURE KIT) DEVI 1 Device by Does not apply route once a week. (Patient not taking: Reported on 02/04/2024) 1 each 0    ferrous sulfate  325 (65 FE) MG EC tablet Take 325 mg by mouth 3 (three) times daily with meals. (Patient not taking: Reported on 02/04/2024)      ferrous sulfate  325 (65 FE) MG tablet Take 1 tablet (325 mg total) by mouth daily. 30 tablet 0    ondansetron  (ZOFRAN ) 4 MG tablet Take 1 tablet (4 mg total) by mouth every 8 (eight) hours as needed for nausea or vomiting. Take 1 tablet approximately 1 hour before your appointment time for your glucose test. (Patient not taking: Reported on 02/04/2024) 20 tablet 0    Prenat-FeCbn-FeAsp-Meth-FA-DHA (PRENATE MINI ) 18-0.6-0.4-350 MG CAPS Take 1 capsule by mouth daily before breakfast. 90 capsule 4     Review of Systems  Gastrointestinal:  Positive for abdominal pain (Tightness). Negative for constipation, diarrhea, nausea  and vomiting.  Genitourinary:  Negative for difficulty urinating, dysuria, vaginal bleeding and vaginal discharge.   Physical Exam   Blood pressure 118/71, pulse 92, temperature 98.7 F (37.1 C), temperature source Oral, resp. rate 16, height 5' 5 (1.651 m), weight 64.4 kg, last menstrual period 07/08/2023, SpO2 100%.  Physical Exam Vitals and nursing note reviewed. Exam conducted with a chaperone present Sherolyn, RN).  Constitutional:      Appearance: Normal appearance.  HENT:     Head: Normocephalic and atraumatic.  Eyes:     Conjunctiva/sclera: Conjunctivae normal.  Cardiovascular:     Rate and Rhythm: Normal rate.  Pulmonary:     Effort: Pulmonary effort is normal. No respiratory distress.  Genitourinary:    General: Normal vulva.     Comments: Speculum Exam: -Normal External Genitalia: Non tender, no apparent discharge at introitus.  -Vaginal Vault: Pink mucosa with good rugae.  Moderate amt grayish white discharge -wet prep collected -Cervix:Pink, no lesions, cysts, or polyps.  Appears slightly open. No active bleeding from os-GC/CT collected -Bimanual Exam: Dilation: 1/Thick/Posterior Exam by:: DOROTHA Duncans, cnm  Musculoskeletal:        General: Normal range of motion.     Cervical back: Normal range of motion.     Comments: Bilateral flank pain  Skin:    General: Skin is warm and dry.  Neurological:     Mental Status: She is alert and oriented to person, place, and time.  Psychiatric:        Mood and Affect: Mood normal.        Behavior: Behavior normal.     Fetal Assessment 140 bpm, Mod Var, -Decels, +Accels Toco: 3 Mild ctx graphed  MAU Course   Results for orders placed or performed during the hospital encounter of 02/06/24 (from the past 24 hours)  Urinalysis, Routine w reflex microscopic -Urine, Clean Catch     Status: Abnormal   Collection Time: 02/06/24  9:11 PM  Result Value Ref Range   Color, Urine YELLOW YELLOW   APPearance HAZY (A) CLEAR   Specific Gravity, Urine 1.020 1.005 - 1.030   pH 6.0 5.0 - 8.0   Glucose, UA NEGATIVE NEGATIVE mg/dL   Hgb urine dipstick NEGATIVE NEGATIVE   Bilirubin Urine NEGATIVE NEGATIVE   Ketones, ur NEGATIVE NEGATIVE mg/dL   Protein, ur NEGATIVE NEGATIVE mg/dL   Nitrite NEGATIVE NEGATIVE   Leukocytes,Ua MODERATE (A) NEGATIVE   RBC / HPF 0-5 0 - 5 RBC/hpf   WBC, UA 0-5 0 - 5 WBC/hpf   Bacteria, UA RARE (A) NONE SEEN   Squamous Epithelial / HPF 6-10 0 - 5 /HPF   Mucus PRESENT   Wet prep, genital     Status: Abnormal   Collection Time: 02/06/24  9:39 PM   Specimen: Urine, Clean Catch  Result Value Ref Range   Yeast Wet Prep HPF POC NONE SEEN NONE SEEN   Trich, Wet Prep NONE SEEN NONE SEEN   Clue Cells Wet Prep HPF POC PRESENT (A) NONE SEEN   WBC, Wet Prep HPF POC >=10 (A) <10   Sperm NONE SEEN    No results found.   MDM PE Labs: UA, Wet Prep, GC/CT EFM Prescription Assessment and Plan   38 year  old [id number]  SIUP at 30.3 weeks Cat I FT Flank Pain  -Orders placed.  -Exam performed and findings discussed. -Informed that findings are suspicious for bacterial infection.  -Discussed how this can cause cramping/discomfort as well as PTL/D in pregnancy. -Wet  prep and GC/CT sent; patient agreeable.  -Give oral hydration. -Await results.    Harlene LITTIE Duncans MSN, CNM 02/06/2024, 9:27 PM   Reassessment (10:16 PM) -Results as above. -Patient informed of findings.  -Reviewed treatment for BV.  -Patient opts for vaginal treatment.  -Discussed ways to prevent bacterial vaginosis:  +Wear cotton underwear +Use low scent or scent free soaps and detergents. -Precautions reviewed. -Encouraged to call primary office or return to MAU if symptoms worsen or with the onset of new symptoms. -Discharged to home in stable condition.  Harlene LITTIE Duncans MSN, CNM Advanced Practice Provider, [facility] [employer]

## 2024-02-06 NOTE — MAU Note (Signed)
 Pt here c/o of lower abd pain since 1600, denies bleeding of fluid. Also c/o of bil abd side pain. No c/o with pregnancy

## 2024-02-07 ENCOUNTER — Other Ambulatory Visit: Payer: Self-pay | Admitting: Nurse Practitioner

## 2024-02-07 ENCOUNTER — Telehealth: Payer: Self-pay

## 2024-02-07 ENCOUNTER — Ambulatory Visit: Payer: Self-pay | Admitting: Nurse Practitioner

## 2024-02-07 DIAGNOSIS — O99019 Anemia complicating pregnancy, unspecified trimester: Secondary | ICD-10-CM | POA: Insufficient documentation

## 2024-02-07 LAB — GC/CHLAMYDIA PROBE AMP (~~LOC~~) NOT AT ARMC
Chlamydia: NEGATIVE
Comment: NEGATIVE
Comment: NORMAL
Neisseria Gonorrhea: NEGATIVE

## 2024-02-07 NOTE — Telephone Encounter (Signed)
 Olam, patient will be scheduled as soon as possible.  Auth Submission: NO AUTH NEEDED Site of care: Site of care: [facility] Payer: [employer] Medication & CPT/J Code(s) submitted: Venofer (Iron Sucrose) J1756 Diagnosis Code:  Route of submission (phone, fax, portal):  Phone # Fax # Auth type: Buy/Bill PB Units/visits requested: 200mg  x 5 doses Reference number:  Approval from: 02/07/24 to 06/08/24

## 2024-02-07 NOTE — Progress Notes (Signed)
 Iron transfusion orders placed.

## 2024-02-12 ENCOUNTER — Ambulatory Visit

## 2024-02-12 VITALS — BP 105/67 | HR 88 | Temp 98.3°F | Resp 16 | Ht 65.0 in | Wt 145.8 lb

## 2024-02-12 DIAGNOSIS — D509 Iron deficiency anemia, unspecified: Secondary | ICD-10-CM | POA: Diagnosis not present

## 2024-02-12 DIAGNOSIS — Z3A31 31 weeks gestation of pregnancy: Secondary | ICD-10-CM | POA: Diagnosis not present

## 2024-02-12 DIAGNOSIS — O99013 Anemia complicating pregnancy, third trimester: Secondary | ICD-10-CM

## 2024-02-12 MED ORDER — IRON SUCROSE 20 MG/ML IV SOLN
200.0000 mg | Freq: Once | INTRAVENOUS | Status: AC
  Administered 2024-02-12: 200 mg via INTRAVENOUS
  Filled 2024-02-12: qty 10

## 2024-02-12 NOTE — Progress Notes (Signed)
 Diagnosis: Iron  Deficiency Anemia  Provider:  Praveen Mannam MD  Procedure: IV Push  IV Type: Peripheral, IV Location: L Antecubital  Venofer  (Iron  Sucrose), Dose: 200 mg  Post Infusion IV Care: Observation period completed and Peripheral IV Discontinued  Discharge: Condition: Good, Destination: Home . AVS Declined  Performed by:  Leita FORBES Miles, LPN

## 2024-02-17 ENCOUNTER — Ambulatory Visit (INDEPENDENT_AMBULATORY_CARE_PROVIDER_SITE_OTHER)

## 2024-02-17 VITALS — BP 110/66 | HR 74 | Temp 98.4°F | Resp 16 | Ht 65.0 in | Wt 143.4 lb

## 2024-02-17 DIAGNOSIS — O99013 Anemia complicating pregnancy, third trimester: Secondary | ICD-10-CM | POA: Diagnosis not present

## 2024-02-17 DIAGNOSIS — D509 Iron deficiency anemia, unspecified: Secondary | ICD-10-CM

## 2024-02-17 DIAGNOSIS — Z3A32 32 weeks gestation of pregnancy: Secondary | ICD-10-CM

## 2024-02-17 MED ORDER — SODIUM CHLORIDE 0.9 % IV BOLUS
100.0000 mL | Freq: Once | INTRAVENOUS | Status: AC
  Administered 2024-02-17: 100 mL via INTRAVENOUS
  Filled 2024-02-17: qty 100

## 2024-02-17 MED ORDER — IRON SUCROSE 20 MG/ML IV SOLN
200.0000 mg | Freq: Once | INTRAVENOUS | Status: AC
  Administered 2024-02-17: 200 mg via INTRAVENOUS
  Filled 2024-02-17: qty 10

## 2024-02-17 NOTE — Progress Notes (Signed)
 Diagnosis: Iron Deficiency Anemia  Provider:  Chilton Greathouse MD  Procedure: IV Push  IV Type: Peripheral, IV Location: L Forearm  Venofer (Iron Sucrose), Dose: 200 mg  Post Infusion IV Care: Patient declined observation and Peripheral IV Discontinued  Discharge: Condition: Good, Destination: Home . AVS Declined  Performed by:  Garnette Czech, RN

## 2024-02-19 ENCOUNTER — Ambulatory Visit (INDEPENDENT_AMBULATORY_CARE_PROVIDER_SITE_OTHER)

## 2024-02-19 ENCOUNTER — Ambulatory Visit (INDEPENDENT_AMBULATORY_CARE_PROVIDER_SITE_OTHER): Admitting: Obstetrics and Gynecology

## 2024-02-19 VITALS — BP 110/70 | HR 86 | Wt 149.0 lb

## 2024-02-19 VITALS — BP 107/71 | HR 91 | Temp 98.1°F | Resp 18 | Ht 65.0 in | Wt 148.0 lb

## 2024-02-19 DIAGNOSIS — O99013 Anemia complicating pregnancy, third trimester: Secondary | ICD-10-CM | POA: Diagnosis not present

## 2024-02-19 DIAGNOSIS — D509 Iron deficiency anemia, unspecified: Secondary | ICD-10-CM

## 2024-02-19 DIAGNOSIS — O09523 Supervision of elderly multigravida, third trimester: Secondary | ICD-10-CM | POA: Diagnosis not present

## 2024-02-19 DIAGNOSIS — Z3A32 32 weeks gestation of pregnancy: Secondary | ICD-10-CM

## 2024-02-19 DIAGNOSIS — Z3480 Encounter for supervision of other normal pregnancy, unspecified trimester: Secondary | ICD-10-CM

## 2024-02-19 MED ORDER — SODIUM CHLORIDE 0.9 % IV BOLUS
100.0000 mL | Freq: Once | INTRAVENOUS | Status: AC
  Administered 2024-02-19: 100 mL via INTRAVENOUS
  Filled 2024-02-19: qty 100

## 2024-02-19 MED ORDER — IRON SUCROSE 20 MG/ML IV SOLN
200.0000 mg | Freq: Once | INTRAVENOUS | Status: AC
  Administered 2024-02-19: 200 mg via INTRAVENOUS
  Filled 2024-02-19: qty 10

## 2024-02-19 NOTE — Progress Notes (Signed)
 Diagnosis: Iron Deficiency Anemia  Provider:  Chilton Greathouse MD  Procedure: IV Push  IV Type: Peripheral, IV Location: L Forearm  Venofer (Iron Sucrose), Dose: 200 mg  Post Infusion IV Care: Patient declined observation and Peripheral IV Discontinued  Discharge: Condition: Good, Destination: Home . AVS Declined  Performed by:  Garnette Czech, RN

## 2024-02-19 NOTE — Progress Notes (Signed)
 Pt has iron  infusion today.  Please discuss need for iron  supplement or if she only needs infusions.

## 2024-02-19 NOTE — Progress Notes (Signed)
   PRENATAL VISIT NOTE  Subjective:  Donna Lowery is a 38 y.o. [id number] at [redacted]w[redacted]d being seen today for ongoing prenatal care.  She is currently monitored for the following issues for this high-risk pregnancy and has BMI less than 19,adult; HSIL on Pap smear of cervix; Supervision of other normal pregnancy, antepartum; Carrier of spinal muscular atrophy; Advanced maternal age in multigravida, third trimester; and Iron  deficiency anemia of mother during pregnancy on their problem list.  Patient reports no complaints.  Contractions: Irregular. Vag. Bleeding: None.  Movement: Present. Denies leaking of fluid.   The following portions of the patient's history were reviewed and updated as appropriate: allergies, current medications, past family history, past medical history, past social history, past surgical history and problem list.   Objective:   Vitals:   02/19/24 1107  BP: 110/70  Pulse: 86  Weight: 149 lb (67.6 kg)   Body mass index is 24.79 kg/m. Total weight gain: 41 lb (18.6 kg)   Fetal Status: Fetal Heart Rate (bpm): 140 Fundal Height: 32 cm Movement: Present     General:  Alert, oriented and cooperative. Patient is in no acute distress.  Skin: Skin is warm and dry. No rash noted.   Cardiovascular: Normal heart rate noted  Respiratory: Normal respiratory effort, no problems with respiration noted  Abdomen: Soft, gravid, appropriate for gestational age.  Pain/Pressure: Present     Pelvic: Cervical exam deferred        Extremities: Normal range of motion.     Mental Status: Normal mood and affect. Normal behavior. Normal judgment and thought content.   Assessment and Plan:  Pregnancy: [id number] at [redacted]w[redacted]d 1. Supervision of other normal pregnancy, antepartum (Primary) Anticipatory guidance Plans Mirena IUD for contraception  2. Advanced maternal age in multigravida, third trimester Low risk NIPT  3. Iron  deficiency anemia of mother during pregnancy Getting iron   infusions  Preterm labor symptoms and general obstetric precautions including but not limited to vaginal bleeding, contractions, leaking of fluid and fetal movement were reviewed in detail with the patient. Please refer to After Visit Summary for other counseling recommendations.   Return in about 2 weeks (around 03/04/2024).  Future Appointments  Date Time Provider Department Center  02/19/2024 12:30 PM [facility] None  02/21/2024 11:00 AM [facility] None  02/24/2024 11:00 AM [facility] None    Rollo ONEIDA Bring, MD

## 2024-02-21 ENCOUNTER — Ambulatory Visit

## 2024-02-21 VITALS — BP 109/68 | HR 96 | Temp 98.6°F | Resp 20 | Ht 65.0 in | Wt 147.8 lb

## 2024-02-21 DIAGNOSIS — Z3A32 32 weeks gestation of pregnancy: Secondary | ICD-10-CM | POA: Diagnosis not present

## 2024-02-21 DIAGNOSIS — O99013 Anemia complicating pregnancy, third trimester: Secondary | ICD-10-CM

## 2024-02-21 DIAGNOSIS — D509 Iron deficiency anemia, unspecified: Secondary | ICD-10-CM | POA: Diagnosis not present

## 2024-02-21 MED ORDER — IRON SUCROSE 20 MG/ML IV SOLN
200.0000 mg | Freq: Once | INTRAVENOUS | Status: AC
  Administered 2024-02-21: 200 mg via INTRAVENOUS
  Filled 2024-02-21: qty 10

## 2024-02-21 MED ORDER — SODIUM CHLORIDE 0.9 % IV BOLUS
250.0000 mL | Freq: Once | INTRAVENOUS | Status: AC
  Administered 2024-02-21: 250 mL via INTRAVENOUS
  Filled 2024-02-21: qty 250

## 2024-02-21 NOTE — Progress Notes (Signed)
 Diagnosis: Iron  Deficiency Anemia  Provider:  Praveen Mannam MD  Procedure: IV Push  IV Type: Peripheral, IV Location: L Forearm  Actemra (Tocilizumab), Venofer  (Iron  Sucrose), Dose: 200 mg  Post Infusion IV Care: Patient declined observation and Peripheral IV Discontinued  Discharge: Condition: Good, Destination: Home . AVS Declined  Performed by:  Tarvares Lant, RN

## 2024-02-24 ENCOUNTER — Ambulatory Visit

## 2024-02-24 VITALS — BP 104/67 | HR 94 | Temp 98.8°F | Resp 22 | Ht 65.0 in | Wt 145.0 lb

## 2024-02-24 DIAGNOSIS — O99013 Anemia complicating pregnancy, third trimester: Secondary | ICD-10-CM

## 2024-02-24 DIAGNOSIS — Z3A33 33 weeks gestation of pregnancy: Secondary | ICD-10-CM | POA: Diagnosis not present

## 2024-02-24 DIAGNOSIS — D509 Iron deficiency anemia, unspecified: Secondary | ICD-10-CM | POA: Diagnosis not present

## 2024-02-24 MED ORDER — SODIUM CHLORIDE 0.9 % IV BOLUS (SEPSIS)
250.0000 mL | Freq: Once | INTRAVENOUS | Status: AC
  Administered 2024-02-24: 250 mL via INTRAVENOUS
  Filled 2024-02-24: qty 250

## 2024-02-24 MED ORDER — SODIUM CHLORIDE 0.9 % IV BOLUS
100.0000 mL | Freq: Once | INTRAVENOUS | Status: DC
  Filled 2024-02-24: qty 100

## 2024-02-24 MED ORDER — IRON SUCROSE 20 MG/ML IV SOLN
200.0000 mg | Freq: Once | INTRAVENOUS | Status: AC
  Administered 2024-02-24: 200 mg via INTRAVENOUS
  Filled 2024-02-24: qty 10

## 2024-02-24 NOTE — Progress Notes (Signed)
 Diagnosis: Iron Deficiency Anemia  Provider:  Chilton Greathouse MD  Procedure: IV Push  IV Type: Peripheral, IV Location: R Antecubital  Venofer (Iron Sucrose), Dose: 200 mg  Post Infusion IV Care: Patient declined observation and Peripheral IV Discontinued  Discharge: Condition: Stable, Destination: Home . AVS Declined  Performed by:  Wyvonne Lenz, RN

## 2024-02-24 NOTE — Addendum Note (Signed)
 Addended by: Dwayn Moravek E on: 02/24/2024 11:46 AM   Modules accepted: Orders

## 2024-02-25 DIAGNOSIS — Z3483 Encounter for supervision of other normal pregnancy, third trimester: Secondary | ICD-10-CM | POA: Diagnosis not present

## 2024-02-25 DIAGNOSIS — Z3482 Encounter for supervision of other normal pregnancy, second trimester: Secondary | ICD-10-CM | POA: Diagnosis not present

## 2024-03-04 ENCOUNTER — Other Ambulatory Visit (HOSPITAL_COMMUNITY)
Admission: RE | Admit: 2024-03-04 | Discharge: 2024-03-04 | Disposition: A | Discharge status: Home / Self Care | Source: Ambulatory Visit | Attending: Family Medicine | Admitting: Family Medicine

## 2024-03-04 ENCOUNTER — Encounter: Payer: Self-pay | Admitting: Family Medicine

## 2024-03-04 ENCOUNTER — Ambulatory Visit: Admitting: Family Medicine

## 2024-03-04 VITALS — BP 124/69 | HR 97 | Wt 147.2 lb

## 2024-03-04 DIAGNOSIS — R87613 High grade squamous intraepithelial lesion on cytologic smear of cervix (HGSIL): Secondary | ICD-10-CM | POA: Diagnosis not present

## 2024-03-04 DIAGNOSIS — O99013 Anemia complicating pregnancy, third trimester: Secondary | ICD-10-CM

## 2024-03-04 DIAGNOSIS — O09523 Supervision of elderly multigravida, third trimester: Secondary | ICD-10-CM

## 2024-03-04 DIAGNOSIS — N898 Other specified noninflammatory disorders of vagina: Secondary | ICD-10-CM

## 2024-03-04 DIAGNOSIS — D509 Iron deficiency anemia, unspecified: Secondary | ICD-10-CM

## 2024-03-04 DIAGNOSIS — Z3480 Encounter for supervision of other normal pregnancy, unspecified trimester: Secondary | ICD-10-CM

## 2024-03-04 DIAGNOSIS — Z148 Genetic carrier of other disease: Secondary | ICD-10-CM

## 2024-03-04 DIAGNOSIS — Z3A34 34 weeks gestation of pregnancy: Secondary | ICD-10-CM | POA: Diagnosis not present

## 2024-03-04 NOTE — Patient Instructions (Addendum)
 Continue your iron  pills on Monday, Wednesday, and Friday for the rest of your pregnancy.  We will not be getting any more ultrasounds here, so feel free to go to one of the places around [place] like [employer]!

## 2024-03-04 NOTE — Progress Notes (Signed)
 Pt presents for rob. Pt wants to know if she should continue her iron  pills or not. Pt has no other questions or concerns at this time.

## 2024-03-04 NOTE — Progress Notes (Signed)
   PRENATAL VISIT NOTE  Subjective:  Donna Lowery is a 38 y.o. [id number] at [redacted]w[redacted]d being seen today for ongoing prenatal care.  She is currently monitored for the following issues for this high-risk pregnancy and has BMI less than 19,adult; HSIL on Pap smear of cervix; Supervision of other normal pregnancy, antepartum; Carrier of spinal muscular atrophy; Advanced maternal age in multigravida, third trimester; and Iron  deficiency anemia of mother during pregnancy on their problem list.  Patient reports no complaints.  Contractions: Irritability. Vag. Bleeding: None.  Movement: Present. Denies leaking of fluid.   The following portions of the patient's history were reviewed and updated as appropriate: allergies, current medications, past family history, past medical history, past social history, past surgical history and problem list.   Objective:    Vitals:   03/04/24 1125  BP: 124/69  Pulse: 97  Weight: 147 lb 3.2 oz (66.8 kg)    Fetal Status:  Fetal Heart Rate (bpm): 129 Fundal Height: 36 cm Movement: Present    General: Alert, oriented and cooperative. Patient is in no acute distress.  Skin: Skin is warm and dry. No rash noted.   Cardiovascular: Normal heart rate noted  Respiratory: Normal respiratory effort, no problems with respiration noted  Abdomen: Soft, gravid, appropriate for gestational age.  Pain/Pressure: Present     Pelvic: Cervical exam deferred        Extremities: Normal range of motion.  Edema: None  Mental Status: Normal mood and affect. Normal behavior. Normal judgment and thought content.   Assessment and Plan:  Pregnancy: [id number] at [redacted]w[redacted]d 1. Supervision of other normal pregnancy, antepartum (Primary) Prenatal course reviewed BP, HR, FHR within normal limits Feeling regular FM   2. Iron  deficiency anemia of mother during pregnancy Finished iron  infusions Continue oral iron  on Monday, Wednesday, Friday  3. HSIL on Pap smear of cervix Postpartum  colpo  4. Carrier of spinal muscular atrophy  5. Advanced maternal age in multigravida, third trimester  6. [redacted] weeks gestation of pregnancy Swabs for GBS and GC/CT at next visit  7. Vaginal irritation - Cervicovaginal ancillary only   Preterm labor symptoms and general obstetric precautions including but not limited to vaginal bleeding, contractions, leaking of fluid and fetal movement were reviewed in detail with the patient. Please refer to After Visit Summary for other counseling recommendations.   Return in about 2 weeks (around 03/18/2024) for HOB+GBS.  No future appointments.  Joesph DELENA Sear, PA

## 2024-03-05 ENCOUNTER — Ambulatory Visit: Payer: Self-pay | Admitting: Family Medicine

## 2024-03-05 DIAGNOSIS — B3731 Acute candidiasis of vulva and vagina: Secondary | ICD-10-CM

## 2024-03-05 LAB — CERVICOVAGINAL ANCILLARY ONLY
Bacterial Vaginitis (gardnerella): NEGATIVE
Candida Glabrata: NEGATIVE
Candida Vaginitis: POSITIVE — AB
Comment: NEGATIVE
Comment: NEGATIVE
Comment: NEGATIVE

## 2024-03-05 MED ORDER — FLUCONAZOLE 150 MG PO TABS: 150.0000 mg | ORAL_TABLET | Freq: Once | ORAL | 1 refills | Status: AC

## 2024-03-17 ENCOUNTER — Encounter: Admitting: Obstetrics and Gynecology

## 2024-03-17 ENCOUNTER — Encounter (HOSPITAL_COMMUNITY): Payer: Self-pay | Admitting: Obstetrics and Gynecology

## 2024-03-17 ENCOUNTER — Inpatient Hospital Stay (HOSPITAL_COMMUNITY)
Admission: AD | Admit: 2024-03-17 | Discharge: 2024-03-17 | Disposition: A | Discharge status: Home / Self Care | Attending: Obstetrics and Gynecology | Admitting: Obstetrics and Gynecology

## 2024-03-17 DIAGNOSIS — Z3A36 36 weeks gestation of pregnancy: Secondary | ICD-10-CM | POA: Diagnosis not present

## 2024-03-17 DIAGNOSIS — Z3480 Encounter for supervision of other normal pregnancy, unspecified trimester: Secondary | ICD-10-CM

## 2024-03-17 DIAGNOSIS — O4703 False labor before 37 completed weeks of gestation, third trimester: Secondary | ICD-10-CM | POA: Diagnosis not present

## 2024-03-17 DIAGNOSIS — A084 Viral intestinal infection, unspecified: Secondary | ICD-10-CM

## 2024-03-17 DIAGNOSIS — O99613 Diseases of the digestive system complicating pregnancy, third trimester: Secondary | ICD-10-CM

## 2024-03-17 HISTORY — DX: Anemia, unspecified: D64.9

## 2024-03-17 LAB — URINALYSIS, ROUTINE W REFLEX MICROSCOPIC
Bilirubin Urine: NEGATIVE
Glucose, UA: NEGATIVE mg/dL
Hgb urine dipstick: NEGATIVE
Ketones, ur: NEGATIVE mg/dL
Leukocytes,Ua: NEGATIVE
Nitrite: NEGATIVE
Protein, ur: NEGATIVE mg/dL
Specific Gravity, Urine: 1.016 (ref 1.005–1.030)
pH: 6 (ref 5.0–8.0)

## 2024-03-17 MED ORDER — LACTATED RINGERS IV BOLUS
1000.0000 mL | Freq: Once | INTRAVENOUS | Status: AC
  Administered 2024-03-17: 1000 mL via INTRAVENOUS

## 2024-03-17 NOTE — MAU Provider Note (Signed)
 History     CSN: [id number]  Arrival date and time: 03/17/24 0016   Event Date/Time   First Provider Initiated Contact with Patient 03/17/24 0104      Chief Complaint  Patient presents with   Contractions   HPI Patient is a 38 year old G5, P2 at 36 weeks 1 day presenting for lower abdominal pain, diarrhea for 24 hours, 1 episode of vomiting, and regular contractions every 6 minutes.  Reports that the diarrhea started today and she is not quite sure what the causes.  Typically has 1 regular bowel movement daily but has had multiple today.  Has not increased her p.o. intake.  OB History     Gravida  5   Para  2   Term  2   Preterm  0   AB  2   Living  2      SAB  1   IAB  1   Ectopic  0   Multiple  0   Live Births  2           Past Medical History:  Diagnosis Date   Anemia    Anxiety     Past Surgical History:  Procedure Laterality Date   BREAST ENHANCEMENT SURGERY Bilateral 01/26/2020   WISDOM TOOTH EXTRACTION Bilateral     Family History  Problem Relation Age of Onset   Healthy Mother    Asthma Sister    Hypertension Maternal Grandmother    Diabetes Maternal Grandmother    Cancer Neg Hx     Social History   Tobacco Use   Smoking status: Every Day    Current packs/day: 0.25    Average packs/day: 0.7 packs/day for 10.8 years (7.7 ttl pk-yrs)    Types: Cigarettes    Start date: 06/2010    Last attempt to quit: 06/2020   Smokeless tobacco: Never   Tobacco comments:    gave her the [employer] literature  Vaping Use   Vaping status: Never Used  Substance Use Topics   Alcohol use: Not Currently    Comment: occasional   Drug use: Never    Allergies: No Known Allergies  Medications Prior to Admission  Medication Sig Dispense Refill Last Dose/Taking   Prenat-FeCbn-FeAsp-Meth-FA-DHA (PRENATE MINI ) 18-0.6-0.4-350 MG CAPS Take 1 capsule by mouth daily before breakfast. 90 capsule 4 03/16/2024   Blood Pressure Monitoring (BLOOD PRESSURE  KIT) DEVI 1 Device by Does not apply route once a week. (Patient not taking: Reported on 02/04/2024) 1 each 0    ferrous sulfate  325 (65 FE) MG tablet Take 1 tablet (325 mg total) by mouth daily. (Patient not taking: Reported on 03/04/2024) 30 tablet 0     Review of Systems  Gastrointestinal:  Positive for abdominal pain, diarrhea and vomiting. Negative for nausea.  Genitourinary:  Negative for vaginal bleeding and vaginal discharge.   Physical Exam   Blood pressure 127/76, pulse 91, temperature 98.9 F (37.2 C), resp. rate 18, height 5' 5 (1.651 m), weight 68.5 kg, last menstrual period 07/08/2023.  Physical Exam Vitals and nursing note reviewed.  Constitutional:      Appearance: Normal appearance.  HENT:     Head: Normocephalic and atraumatic.     Nose: No congestion or rhinorrhea.  Eyes:     Extraocular Movements: Extraocular movements intact.  Cardiovascular:     Rate and Rhythm: Normal rate.  Pulmonary:     Effort: Pulmonary effort is normal.  Abdominal:     Palpations: Abdomen is soft.  Tenderness: There is no abdominal tenderness.  Genitourinary:    Comments: 3/70/-2 Musculoskeletal:        General: Normal range of motion.     Cervical back: Normal range of motion.  Skin:    General: Skin is warm.     Capillary Refill: Capillary refill takes less than 2 seconds.  Neurological:     General: No focal deficit present.     Mental Status: She is alert.     Cranial Nerves: No cranial nerve deficit.  Psychiatric:        Mood and Affect: Mood normal.        Behavior: Behavior normal.     MAU Course  Procedures  MDM LR fluid bolus  Assessment and Plan  Tomorrow Donna Lowery is a 38 year old G5, P2 at 36 weeks 1 day presenting for follow-up to get diarrhea as well as contractions  Viral gastroenteritis Early labor Signs and symptoms consistent with viral gastroenteritis.  Discussed supportive care including ensuring she is drinking plenty of fluids.  Contractions  likely being affected by dehydration from the diarrhea.  Fluid bolus administered due to diarrhea and contractions.  Little effect on the contractions.  Likely early labor.  Discussed supportive care measures and return precautions.  Patient discharged home.  [name] 03/17/2024, 1:33 AM

## 2024-03-17 NOTE — Discharge Instructions (Signed)
 It was a pleasure taking care of you night.  I am sorry you are having that diarrhea and this may be a virus that is causing the diarrhea.  Regarding labor it looks like you are in early labor.  You are contracting regularly but if they become painful and are every 3 to 5 minutes I want you to return.  I hope you have a great rest of your day!

## 2024-03-17 NOTE — MAU Note (Signed)
 Donna Lowery is a 38 y.o. at [redacted]w[redacted]d here in MAU reporting: has had diarrhea  all day and vomited x1 . Having ctx  all day get worse when she tries to lay down. A lot pressure in her back and rectal area Denies any vag bleeding or leaking. Good fetal movement felt.   LMP:  Onset of complaint: yesterday Pain score: 7-8 Vitals:   03/17/24 0032  BP: 127/76  Pulse: 91  Resp: 18  Temp: 98.9 F (37.2 C)     FHT: 145  Lab orders placed from triage: u/a

## 2024-03-24 ENCOUNTER — Encounter: Payer: Self-pay | Admitting: Obstetrics

## 2024-03-24 ENCOUNTER — Ambulatory Visit: Admitting: Obstetrics

## 2024-03-24 ENCOUNTER — Other Ambulatory Visit (HOSPITAL_COMMUNITY)
Admission: RE | Admit: 2024-03-24 | Discharge: 2024-03-24 | Disposition: A | Discharge status: Home / Self Care | Source: Ambulatory Visit | Attending: Obstetrics | Admitting: Obstetrics

## 2024-03-24 VITALS — BP 115/72 | HR 96 | Wt 149.9 lb

## 2024-03-24 DIAGNOSIS — D509 Iron deficiency anemia, unspecified: Secondary | ICD-10-CM

## 2024-03-24 DIAGNOSIS — O0990 Supervision of high risk pregnancy, unspecified, unspecified trimester: Secondary | ICD-10-CM | POA: Diagnosis not present

## 2024-03-24 DIAGNOSIS — O09523 Supervision of elderly multigravida, third trimester: Secondary | ICD-10-CM | POA: Diagnosis not present

## 2024-03-24 DIAGNOSIS — Z3480 Encounter for supervision of other normal pregnancy, unspecified trimester: Secondary | ICD-10-CM | POA: Diagnosis present

## 2024-03-24 DIAGNOSIS — Z148 Genetic carrier of other disease: Secondary | ICD-10-CM | POA: Diagnosis not present

## 2024-03-24 DIAGNOSIS — Z681 Body mass index (BMI) 19 or less, adult: Secondary | ICD-10-CM

## 2024-03-24 DIAGNOSIS — O99019 Anemia complicating pregnancy, unspecified trimester: Secondary | ICD-10-CM

## 2024-03-24 DIAGNOSIS — R87613 High grade squamous intraepithelial lesion on cytologic smear of cervix (HGSIL): Secondary | ICD-10-CM

## 2024-03-24 DIAGNOSIS — F17200 Nicotine dependence, unspecified, uncomplicated: Secondary | ICD-10-CM

## 2024-03-24 NOTE — Progress Notes (Signed)
 Subjective:  Donna Lowery is a 38 y.o. [id number] at [redacted]w[redacted]d being seen today for ongoing prenatal care.  She is currently monitored for the following issues for this high-risk pregnancy and has BMI less than 19,adult; HSIL on Pap smear of cervix; Supervision of other normal pregnancy, antepartum; Carrier of spinal muscular atrophy; Advanced maternal age in multigravida, third trimester; and Iron  deficiency anemia of mother during pregnancy on their problem list.  Patient reports no complaints.  Contractions: Irritability. Vag. Bleeding: None.  Movement: Present. Denies leaking of fluid.   The following portions of the patient's history were reviewed and updated as appropriate: allergies, current medications, past family history, past medical history, past social history, past surgical history and problem list. Problem list updated.  Objective:   Vitals:   03/24/24 1012  BP: 115/72  Pulse: 96  Weight: 149 lb 14.4 oz (68 kg)    Fetal Status:     Movement: Present     General:  Alert, oriented and cooperative. Patient is in no acute distress.  Skin: Skin is warm and dry. No rash noted.   Cardiovascular: Normal heart rate noted  Respiratory: Normal respiratory effort, no problems with respiration noted  Abdomen: Soft, gravid, appropriate for gestational age. Pain/Pressure: Present     Pelvic:  Cervical exam deferred        Extremities: Normal range of motion.  Edema: None  Mental Status: Normal mood and affect. Normal behavior. Normal judgment and thought content.   Urinalysis:      Assessment and Plan:  Pregnancy: [id number] at [redacted]w[redacted]d  1. Supervision of high risk pregnancy, antepartum (Primary) Rx: - Culture, beta strep (group b only) - Cervicovaginal ancillary only( [place])  2. Advanced maternal age in multigravida, third trimester  3. BMI less than 19,adult  4. Carrier of spinal muscular atrophy  5. Iron  deficiency anemia of mother during pregnancy  6. HSIL on Pap smear  of cervix - colposcopy 3-4 months postpartum  7. Tobacco dependency - cessation recommended    Term labor symptoms and general obstetric precautions including but not limited to vaginal bleeding, contractions, leaking of fluid and fetal movement were reviewed in detail with the patient. Please refer to After Visit Summary for other counseling recommendations.   Return in about 1 week (around 03/31/2024) for [facility].   Rudy Carlin LABOR, MD 03/24/2024

## 2024-03-25 ENCOUNTER — Ambulatory Visit: Payer: Self-pay | Admitting: Obstetrics

## 2024-03-25 DIAGNOSIS — B3731 Acute candidiasis of vulva and vagina: Secondary | ICD-10-CM

## 2024-03-25 LAB — CERVICOVAGINAL ANCILLARY ONLY
Bacterial Vaginitis (gardnerella): NEGATIVE
Candida Glabrata: NEGATIVE
Candida Vaginitis: POSITIVE — AB
Chlamydia: NEGATIVE
Comment: NEGATIVE
Comment: NEGATIVE
Comment: NEGATIVE
Comment: NEGATIVE
Comment: NEGATIVE
Comment: NORMAL
Neisseria Gonorrhea: NEGATIVE
Trichomonas: NEGATIVE

## 2024-03-25 MED ORDER — TERCONAZOLE 0.8 % VA CREA: 1.0000 | TOPICAL_CREAM | Freq: Every day | VAGINAL | 0 refills | Status: DC

## 2024-03-26 ENCOUNTER — Telehealth: Payer: Self-pay

## 2024-03-26 NOTE — Telephone Encounter (Signed)
 Returned call, provider approved pt to start maternity leave, pt requesting date 10/12 to start.

## 2024-03-28 LAB — CULTURE, BETA STREP (GROUP B ONLY): Strep Gp B Culture: NEGATIVE

## 2024-03-30 ENCOUNTER — Inpatient Hospital Stay (EMERGENCY_DEPARTMENT_HOSPITAL)
Admission: AD | Admit: 2024-03-30 | Discharge: 2024-03-30 | Disposition: A | Discharge status: Home / Self Care | Source: Home / Self Care | Attending: Obstetrics and Gynecology | Admitting: Obstetrics and Gynecology

## 2024-03-30 ENCOUNTER — Encounter (HOSPITAL_COMMUNITY): Payer: Self-pay | Admitting: Obstetrics and Gynecology

## 2024-03-30 ENCOUNTER — Inpatient Hospital Stay (HOSPITAL_BASED_OUTPATIENT_CLINIC_OR_DEPARTMENT_OTHER)

## 2024-03-30 DIAGNOSIS — R102 Pelvic and perineal pain unspecified side: Secondary | ICD-10-CM | POA: Insufficient documentation

## 2024-03-30 DIAGNOSIS — Z3A38 38 weeks gestation of pregnancy: Secondary | ICD-10-CM

## 2024-03-30 DIAGNOSIS — O471 False labor at or after 37 completed weeks of gestation: Secondary | ICD-10-CM | POA: Insufficient documentation

## 2024-03-30 DIAGNOSIS — O26893 Other specified pregnancy related conditions, third trimester: Secondary | ICD-10-CM | POA: Diagnosis not present

## 2024-03-30 DIAGNOSIS — O368130 Decreased fetal movements, third trimester, not applicable or unspecified: Secondary | ICD-10-CM | POA: Insufficient documentation

## 2024-03-30 DIAGNOSIS — O479 False labor, unspecified: Secondary | ICD-10-CM | POA: Diagnosis not present

## 2024-03-30 NOTE — MAU Provider Note (Signed)
 Chief Complaint  Patient presents with   Labor Eval     Event Date/Time   First Provider Initiated Contact with Patient 03/30/24 0135      S: Donna Lowery  is a 38 y.o. y.o. year old [id number] female at [redacted]w[redacted]d weeks gestation who presents to MAU reportingcontractions every 4-5 minutes, decreased fetal movement and pelvic pressure since this evening.   Vaginal bleeding: Denies Leaking of fluid: Denies  Patient Active Problem List   Diagnosis Date Noted   Iron  deficiency anemia of mother during pregnancy 02/07/2024   Advanced maternal age in multigravida, third trimester 10/29/2023   Carrier of spinal muscular atrophy 10/10/2023   Supervision of other normal pregnancy, antepartum 09/04/2023   HSIL on Pap smear of cervix 01/18/2021   BMI less than 19,adult 11/12/2019    O: Patient Vitals for the past 24 hrs:  BP Temp Pulse Resp Height Weight  03/30/24 0030 118/79 98.7 F (37.1 C) (!) 109 18 5' 5 (1.651 m) 69.9 kg   General: NAD Heart: Regular rate Lungs: Normal rate and effort Abd: Soft, NT, Gravid, S=D Pelvic: NEFG, no blood.  Dilation: 3 Effacement (%): 70 Cervical Position: Posterior Station: -3 Presentation: Vertex Exam by:: V.Mercy Leppla,CNM  NST performed EFM: 140, mod variability, 15x15 accels, no decels Toco: Q 4-6, mild-moderate Audible fetal movement  BPP 10/10  MAU course/MDM Orders Placed This Encounter  Procedures   US  MFM FETAL BPP WO NON STRESS   Discharge patient   No orders of the defined types were placed in this encounter.   - Decreased fetal movement with reactive NST and BPP 10/10.  Patient feeling some movement in MAU.  - [redacted] weeks gestation with contractions and pelvic pressure.  No cervical change in MAU or since previous exam.  No evidence of active labor.   A: [redacted]w[redacted]d week IUP Decreased fetal movement Braxton Hicks  P: Discharge home in stable condition. Labor precautions and fetal kick counts. Follow-up as scheduled for prenatal  visit or sooner as needed if symptoms worsen. Return to maternity admissions as needed if symptoms worsen.  Donna, Marcianne Lowery , CNM 03/30/2024 2:54 AM

## 2024-03-30 NOTE — MAU Note (Signed)
 Donna Lowery is a 38 y.o. at [redacted]w[redacted]d here in MAU reporting: has had ct all day got closer together a few hours ago about 4-5 min apart. Pelvic pressure is what brought her in  . When she gets up or walk pelvic pressure is unbearable.  Reports a little less fetal movement since cts started but still moving regularly. Denies any vag bleeding or discharge.  3cm 2 weeks ago.   LMP:  Onset of complaint: all day Pain score: 10 Vitals:   03/30/24 0030  BP: 118/79  Pulse: (!) 109  Resp: 18  Temp: 98.7 F (37.1 C)     FHT: 140 labor eval  Lab orders placed from triage:

## 2024-03-31 ENCOUNTER — Other Ambulatory Visit: Payer: Self-pay

## 2024-03-31 ENCOUNTER — Inpatient Hospital Stay (HOSPITAL_COMMUNITY)
Admission: AD | Admit: 2024-03-31 | Discharge: 2024-04-01 | DRG: 807 | Disposition: A | Discharge status: Home / Self Care | Attending: Obstetrics & Gynecology | Admitting: Obstetrics & Gynecology

## 2024-03-31 ENCOUNTER — Encounter (HOSPITAL_COMMUNITY): Payer: Self-pay | Admitting: Obstetrics & Gynecology

## 2024-03-31 DIAGNOSIS — O479 False labor, unspecified: Secondary | ICD-10-CM | POA: Diagnosis not present

## 2024-03-31 DIAGNOSIS — F17210 Nicotine dependence, cigarettes, uncomplicated: Secondary | ICD-10-CM | POA: Diagnosis not present

## 2024-03-31 DIAGNOSIS — R030 Elevated blood-pressure reading, without diagnosis of hypertension: Secondary | ICD-10-CM | POA: Diagnosis present

## 2024-03-31 DIAGNOSIS — Z3A38 38 weeks gestation of pregnancy: Secondary | ICD-10-CM | POA: Diagnosis not present

## 2024-03-31 DIAGNOSIS — Z148 Genetic carrier of other disease: Secondary | ICD-10-CM

## 2024-03-31 DIAGNOSIS — O99334 Smoking (tobacco) complicating childbirth: Secondary | ICD-10-CM | POA: Diagnosis not present

## 2024-03-31 DIAGNOSIS — O9902 Anemia complicating childbirth: Secondary | ICD-10-CM | POA: Diagnosis present

## 2024-03-31 DIAGNOSIS — D509 Iron deficiency anemia, unspecified: Secondary | ICD-10-CM | POA: Diagnosis not present

## 2024-03-31 DIAGNOSIS — Z833 Family history of diabetes mellitus: Secondary | ICD-10-CM | POA: Diagnosis not present

## 2024-03-31 DIAGNOSIS — O09523 Supervision of elderly multigravida, third trimester: Secondary | ICD-10-CM | POA: Diagnosis not present

## 2024-03-31 DIAGNOSIS — Z3480 Encounter for supervision of other normal pregnancy, unspecified trimester: Principal | ICD-10-CM

## 2024-03-31 DIAGNOSIS — O26893 Other specified pregnancy related conditions, third trimester: Principal | ICD-10-CM | POA: Diagnosis present

## 2024-03-31 DIAGNOSIS — O4693 Antepartum hemorrhage, unspecified, third trimester: Secondary | ICD-10-CM

## 2024-03-31 DIAGNOSIS — Z8249 Family history of ischemic heart disease and other diseases of the circulatory system: Secondary | ICD-10-CM

## 2024-03-31 LAB — COMPREHENSIVE METABOLIC PANEL WITH GFR
ALT: 12 U/L (ref 0–44)
AST: 19 U/L (ref 15–41)
Albumin: 2.9 g/dL — ABNORMAL LOW (ref 3.5–5.0)
Alkaline Phosphatase: 130 U/L — ABNORMAL HIGH (ref 38–126)
Anion gap: 10 (ref 5–15)
BUN: 12 mg/dL (ref 6–20)
CO2: 17 mmol/L — ABNORMAL LOW (ref 22–32)
Calcium: 8.9 mg/dL (ref 8.9–10.3)
Chloride: 107 mmol/L (ref 98–111)
Creatinine, Ser: 0.93 mg/dL (ref 0.44–1.00)
GFR, Estimated: >60 mL/min (ref >60)
Glucose, Bld: 103 mg/dL — ABNORMAL HIGH (ref 70–99)
Potassium: 3.9 mmol/L (ref 3.5–5.1)
Sodium: 134 mmol/L — ABNORMAL LOW (ref 135–145)
Total Bilirubin: 0.6 mg/dL (ref 0.0–1.2)
Total Protein: 6.5 g/dL (ref 6.5–8.1)

## 2024-03-31 LAB — CBC
HCT: 37.7 % (ref 36.0–46.0)
Hemoglobin: 12.6 g/dL (ref 12.0–15.0)
MCH: 31.7 pg (ref 26.0–34.0)
MCHC: 33.4 g/dL (ref 30.0–36.0)
MCV: 94.7 fL (ref 80.0–100.0)
Platelets: 213 K/uL (ref 150–400)
RBC: 3.98 MIL/uL (ref 3.87–5.11)
RDW: 15 % (ref 11.5–15.5)
WBC: 13.7 K/uL — ABNORMAL HIGH (ref 4.0–10.5)
nRBC: 0 % (ref 0.0–0.2)

## 2024-03-31 LAB — RPR: RPR Ser Ql: NONREACTIVE

## 2024-03-31 LAB — TYPE AND SCREEN
ABO/RH(D): O POS
Antibody Screen: NEGATIVE

## 2024-03-31 MED ORDER — DIBUCAINE (PERIANAL) 1 % EX OINT: 1.0000 | TOPICAL_OINTMENT | CUTANEOUS | Status: DC | PRN

## 2024-03-31 MED ORDER — SOD CITRATE-CITRIC ACID 500-334 MG/5ML PO SOLN: 30.0000 mL | ORAL | Status: DC | PRN

## 2024-03-31 MED ORDER — OXYCODONE-ACETAMINOPHEN 5-325 MG PO TABS: 1.0000 | ORAL_TABLET | ORAL | Status: DC | PRN

## 2024-03-31 MED ORDER — OXYCODONE-ACETAMINOPHEN 5-325 MG PO TABS
2.0000 | ORAL_TABLET | Freq: Once | ORAL | Status: AC
  Administered 2024-03-31: 2 via ORAL
  Filled 2024-03-31: qty 2

## 2024-03-31 MED ORDER — LACTATED RINGERS IV SOLN: INTRAVENOUS | Status: DC

## 2024-03-31 MED ORDER — WITCH HAZEL-GLYCERIN EX PADS: 1.0000 | MEDICATED_PAD | CUTANEOUS | Status: DC | PRN

## 2024-03-31 MED ORDER — PRENATAL MULTIVITAMIN CH: 1.0000 | ORAL_TABLET | Freq: Every day | ORAL | Status: DC

## 2024-03-31 MED ORDER — IBUPROFEN 100 MG/5ML PO SUSP
600.0000 mg | Freq: Four times a day (QID) | ORAL | Status: DC
  Administered 2024-03-31 – 2024-04-01 (×4): 600 mg via ORAL
  Filled 2024-03-31 (×4): qty 30

## 2024-03-31 MED ORDER — ONDANSETRON HCL 4 MG/2ML IJ SOLN: 4.0000 mg | Freq: Four times a day (QID) | INTRAMUSCULAR | Status: DC | PRN

## 2024-03-31 MED ORDER — COMPLETENATE 29-1 MG PO CHEW
1.0000 | CHEWABLE_TABLET | Freq: Every day | ORAL | Status: DC
Start: 2024-03-31 — End: 2024-04-01
  Administered 2024-04-01: 1 via ORAL
  Filled 2024-03-31: qty 1

## 2024-03-31 MED ORDER — SIMETHICONE 80 MG PO CHEW: 80.0000 mg | CHEWABLE_TABLET | ORAL | Status: DC | PRN

## 2024-03-31 MED ORDER — OXYTOCIN-SODIUM CHLORIDE 30-0.9 UT/500ML-% IV SOLN
2.5000 [IU]/h | INTRAVENOUS | Status: DC
  Filled 2024-03-31: qty 500

## 2024-03-31 MED ORDER — BENZOCAINE-MENTHOL 20-0.5 % EX AERO: 1.0000 | INHALATION_SPRAY | CUTANEOUS | Status: DC | PRN

## 2024-03-31 MED ORDER — FLEET ENEMA RE ENEM: 1.0000 | ENEMA | RECTAL | Status: DC | PRN

## 2024-03-31 MED ORDER — SENNOSIDES-DOCUSATE SODIUM 8.6-50 MG PO TABS: 2.0000 | ORAL_TABLET | Freq: Every day | ORAL | Status: DC

## 2024-03-31 MED ORDER — TETANUS-DIPHTH-ACELL PERTUSSIS 5-2-15.5 LF-MCG/0.5 IM SUSP: 0.5000 mL | Freq: Once | INTRAMUSCULAR | Status: DC

## 2024-03-31 MED ORDER — OXYTOCIN BOLUS FROM INFUSION
333.0000 mL | Freq: Once | INTRAVENOUS | Status: AC
  Administered 2024-03-31: 333 mL via INTRAVENOUS

## 2024-03-31 MED ORDER — ONDANSETRON HCL 4 MG/2ML IJ SOLN: 4.0000 mg | INTRAMUSCULAR | Status: DC | PRN

## 2024-03-31 MED ORDER — DIPHENHYDRAMINE HCL 25 MG PO CAPS: 25.0000 mg | ORAL_CAPSULE | Freq: Four times a day (QID) | ORAL | Status: DC | PRN

## 2024-03-31 MED ORDER — COCONUT OIL OIL: 1.0000 | TOPICAL_OIL | Status: DC | PRN

## 2024-03-31 MED ORDER — LIDOCAINE HCL (PF) 1 % IJ SOLN
30.0000 mL | INTRAMUSCULAR | Status: DC | PRN
  Filled 2024-03-31: qty 30

## 2024-03-31 MED ORDER — FENTANYL CITRATE (PF) 100 MCG/2ML IJ SOLN
50.0000 ug | INTRAMUSCULAR | Status: DC | PRN
  Administered 2024-03-31: 100 ug via INTRAVENOUS
  Filled 2024-03-31: qty 2

## 2024-03-31 MED ORDER — IBUPROFEN 600 MG PO TABS
600.0000 mg | ORAL_TABLET | Freq: Four times a day (QID) | ORAL | Status: DC
  Administered 2024-03-31: 600 mg via ORAL
  Filled 2024-03-31: qty 1

## 2024-03-31 MED ORDER — ZOLPIDEM TARTRATE 5 MG PO TABS: 5.0000 mg | ORAL_TABLET | Freq: Every evening | ORAL | Status: DC | PRN

## 2024-03-31 MED ORDER — ONDANSETRON HCL 4 MG PO TABS: 4.0000 mg | ORAL_TABLET | ORAL | Status: DC | PRN

## 2024-03-31 MED ORDER — LACTATED RINGERS IV SOLN: 500.0000 mL | INTRAVENOUS | Status: DC | PRN

## 2024-03-31 MED ORDER — OXYCODONE-ACETAMINOPHEN 5-325 MG PO TABS: 2.0000 | ORAL_TABLET | ORAL | Status: DC | PRN

## 2024-03-31 MED ORDER — ACETAMINOPHEN 325 MG PO TABS
650.0000 mg | ORAL_TABLET | ORAL | Status: DC | PRN
  Administered 2024-03-31 – 2024-04-01 (×3): 650 mg via ORAL
  Filled 2024-03-31 (×3): qty 2

## 2024-03-31 MED ORDER — ACETAMINOPHEN 325 MG PO TABS: 650.0000 mg | ORAL_TABLET | ORAL | Status: DC | PRN

## 2024-03-31 NOTE — H&P (Signed)
 OBSTETRIC ADMISSION HISTORY AND PHYSICAL  Donna Lowery is a 38 y.o. female [phone] with IUP at [redacted]w[redacted]d by LMP c/w [redacted]w[redacted]d US  presenting for SOL. She reports +FMs, No LOF, no blurry vision, headaches or peripheral edema, and RUQ pain.  She plans on bottle feeding. She request interval IUD for birth control. She received her prenatal care at [facility]   Dating: By LMP --->  Estimated Date of Delivery: 04/13/24  Sono:    @[redacted]w[redacted]d , CWD, normal anatomy, breech presentation, 737g, 72% EFW   Prenatal History/Complications: SMA carrier, AMA, iron  deficiency anemia  Past Medical History: Past Medical History:  Diagnosis Date   Anemia    Anxiety     Past Surgical History: Past Surgical History:  Procedure Laterality Date   BREAST ENHANCEMENT SURGERY Bilateral 01/26/2020   WISDOM TOOTH EXTRACTION Bilateral     Obstetrical History: OB History     Gravida  5   Para  2   Term  2   Preterm  0   AB  2   Living  2      SAB  1   IAB  1   Ectopic  0   Multiple  0   Live Births  2           Social History Social History   Socioeconomic History   Marital status: Single    Spouse name: Not on file   Number of children: Not on file   Years of education: Not on file   Highest education level: Not on file  Occupational History   Not on file  Tobacco Use   Smoking status: Every Day    Current packs/day: 0.25    Average packs/day: 0.7 packs/day for 10.8 years (7.7 ttl pk-yrs)    Types: Cigarettes    Start date: 06/2010    Last attempt to quit: 06/2020   Smokeless tobacco: Never   Tobacco comments:    gave her the [employer] literature  Vaping Use   Vaping status: Never Used  Substance and Sexual Activity   Alcohol use: Not Currently    Comment: occasional   Drug use: Never   Sexual activity: Not Currently    Birth control/protection: None  Other Topics Concern   Not on file  Social History Narrative   Not on file   Social Drivers of Health   Financial  Resource Strain: Not on file  Food Insecurity: No Food Insecurity (02/06/2024)   Hunger Vital Sign    Worried About Running Out of Food in the Last Year: Never true    Ran Out of Food in the Last Year: Never true  Transportation Needs: Unknown (02/06/2024)   PRAPARE - Administrator, Civil Service (Medical): No    Lack of Transportation (Non-Medical): Not on file  Physical Activity: Not on file  Stress: Not on file  Social Connections: Not on file    Family History: Family History  Problem Relation Age of Onset   Healthy Mother    Asthma Sister    Hypertension Maternal Grandmother    Diabetes Maternal Grandmother    Cancer Neg Hx     Allergies: No Known Allergies  Medications Prior to Admission  Medication Sig Dispense Refill Last Dose/Taking   Blood Pressure Monitoring (BLOOD PRESSURE KIT) DEVI 1 Device by Does not apply route once a week. (Patient not taking: Reported on 02/04/2024) 1 each 0    ferrous sulfate  325 (65 FE) MG tablet Take 1 tablet (325  mg total) by mouth daily. (Patient not taking: Reported on 03/04/2024) 30 tablet 0    Prenat-FeCbn-FeAsp-Meth-FA-DHA (PRENATE MINI ) 18-0.6-0.4-350 MG CAPS Take 1 capsule by mouth daily before breakfast. 90 capsule 4    terconazole  (TERAZOL 3 ) 0.8 % vaginal cream Place 1 applicator vaginally at bedtime. 20 g 0      Review of Systems   All systems reviewed and negative except as stated in HPI  Blood pressure 106/69, pulse 88, temperature (!) 97.5 F (36.4 C), temperature source Oral, resp. rate 18, last menstrual period 07/08/2023, SpO2 92%. General appearance: alert, cooperative, appears stated age, and visibly uncomfortable Lungs: clear to auscultation bilaterally Heart: regular rate and rhythm Abdomen: soft, non-tender; bowel sounds normal Extremities: Homans sign is negative, no sign of DVT Presentation: cephalic Fetal monitoringBaseline: 145 bpm, Variability: Good {> 6 bpm), Accelerations: Reactive, and  Decelerations: Absent Uterine activityFrequency: Every 3-5 minutes Dilation: 6 Effacement (%): 90 Station: -2 Exam by:: Virginia  Claudene, CNM   Prenatal labs: ABO, Rh: O/Positive/-- (04/22 1051) Antibody: Negative (04/22 1051) Rubella: 4.15 (04/22 1051) RPR: Non Reactive (08/12 0900)  HBsAg: Negative (04/22 1051)  HIV: Non Reactive (08/12 0900)  GBS: Negative/-- (10/14 0127)    Lab Results  Component Value Date   GBS Negative 03/24/2024   GTT normal Genetic screening  low-risk Anatomy US  normal  Immunization History  Administered Date(s) Administered   Tdap 01/21/2024    Prenatal Transfer Tool  Maternal Diabetes: No Genetic Screening: Abnormal:  Results: Other:SMA carrier Maternal Ultrasounds/Referrals: Normal Fetal Ultrasounds or other Referrals:  None Maternal Substance Abuse:  No Significant Maternal Medications:  None Significant Maternal Lab Results: Group B Strep negative Number of Prenatal Visits:greater than 3 verified prenatal visits Maternal Vaccinations:TDap Other Comments:  None   No results found for this or any previous visit (from the past 24 hours).  Patient Active Problem List   Diagnosis Date Noted   Iron  deficiency anemia of mother during pregnancy 02/07/2024   Advanced maternal age in multigravida, third trimester 10/29/2023   Carrier of spinal muscular atrophy 10/10/2023   Supervision of other normal pregnancy, antepartum 09/04/2023   HSIL on Pap smear of cervix 01/18/2021   BMI less than 19,adult 11/12/2019    Assessment/Plan:  Donna Lowery is a 38 y.o. H4E7977 at [redacted]w[redacted]d here for SOL  #Labor: Expectant management #Pain: Epidural #FWB: Category I #GBS status:  negative #Feeding: Formula #Reproductive Life planning: Interval IUD #Circ:  yes  Charlie [name], MD  03/31/2024, 6:00 AM

## 2024-03-31 NOTE — Discharge Summary (Signed)
 Postpartum Discharge Summary  Date of Service updated***     Patient Name: Donna Lowery DOB: [birth year] MRN: [id number]  Date of admission: 03/31/2024 Delivery date:03/31/2024 Delivering provider: JOMARIE CAMPI Lowery Date of discharge: 03/31/2024  Admitting diagnosis: Labor and delivery indication for care or intervention [O75.9] Intrauterine pregnancy: [redacted]w[redacted]d     Secondary diagnosis:  Principal Problem:   Labor and delivery indication for care or intervention  Additional problems: ***    Discharge diagnosis: Term Pregnancy Delivered                                              Post partum procedures:None Augmentation: N/Lowery Complications: None  Hospital course: Onset of Labor With Vaginal Delivery      38 y.o. yo [id number] at [redacted]w[redacted]d was admitted in Active Labor on 03/31/2024. Labor course was complicated by frank bleeding, likely due to rapid progression  Membrane Rupture Time/Date:  ,   Delivery Method:Vaginal, Spontaneous Operative Delivery:N/Lowery Episiotomy: None Lacerations:  1st degree Patient had Lowery postpartum course complicated by ***.  She is ambulating, tolerating Lowery regular diet, passing flatus, and urinating well. Patient is discharged home in stable condition on 03/31/24.  Newborn Data: Birth date:03/31/2024 Birth time:6:44 AM Gender:Female Living status:Living Apgars:9 ,9  Weight:3250 g  Magnesium  Sulfate received: No BMZ received: No Rhophylac:N/Lowery MMR:N/Lowery T-DaP:Given prenatally Flu: No RSV Vaccine received: No Transfusion:No  Immunizations received: Immunization History  Administered Date(s) Administered   Tdap 01/21/2024    Physical exam  Vitals:   03/31/24 0430 03/31/24 0454 03/31/24 0525 03/31/24 0615  BP:  115/66 106/69   Pulse:  93 88   Resp:      Temp:      TempSrc:      SpO2: 92%     Weight:    69.9 kg  Height:    5' 5 (1.651 m)   General: {Exam; general:21111117} Lochia: {Desc;  appropriate/inappropriate:30686::appropriate} Uterine Fundus: {Desc; firm/soft:30687} Incision: {Exam; incision:21111123} DVT Evaluation: {Exam; dvt:2111122} Labs: Lab Results  Component Value Date   WBC 13.7 (H) 03/31/2024   HGB 12.6 03/31/2024   HCT 37.7 03/31/2024   MCV 94.7 03/31/2024   PLT 213 03/31/2024      Latest Ref Rng & Units 10/26/2019   12:22 PM  CMP  Glucose 70 - 99 mg/dL 82   BUN 6 - 20 mg/dL 12   Creatinine 9.55 - 1.00 mg/dL 9.17   Sodium 864 - 854 mmol/L 140   Potassium 3.5 - 5.1 mmol/L 4.6   Chloride 98 - 111 mmol/L 107   CO2 22 - 32 mmol/L 25   Calcium 8.9 - 10.3 mg/dL 9.6   Total Protein 6.5 - 8.1 g/dL 7.8   Total Bilirubin 0.3 - 1.2 mg/dL 0.7   Alkaline Phos 38 - 126 U/L 51   AST 15 - 41 U/L 15   ALT 0 - 44 U/L 16    Edinburgh Score:     No data to display         No data recorded  After visit meds:  Allergies as of 03/31/2024   No Known Allergies   Med Rec must be completed prior to using this [facility]***        Discharge home in stable condition Infant Feeding: Bottle Infant Disposition:home with mother Discharge instruction: per After Visit Summary and Postpartum booklet. Activity: Advance as  tolerated. Pelvic rest for 6 weeks.  Diet: routine diet Future Appointments: Future Appointments  Date Time Provider Department Center  04/02/2024  2:50 PM Donna Lowery LABOR, MD [facility] None   Follow up Visit:   Please schedule this patient for Lowery In person postpartum visit in 6 weeks with the following provider: Any provider. Additional Postpartum F/U:None  Low risk pregnancy Delivery mode:  Vaginal, Spontaneous Anticipated Birth Control:  IUD  Message sent 10/21 to [facility]  03/31/2024 Donna Lowery LABOR Courts, MD

## 2024-03-31 NOTE — MAU Note (Signed)
 Patient states that she has been monitoring her contractions and they are 2 minutes apart. Patient rates her pain a 10 on a 0-10 pain scale. Patient denies any bleeding or LOF. Patient reports +FM. VSS. Monitors applied and assessing.

## 2024-03-31 NOTE — MAU Provider Note (Signed)
 Chief Complaint:  Contractions   Event Date/Time   First Provider Initiated Contact with Patient 03/31/24 [phone]     HPI: Donna Lowery is a 38 y.o. H4E7977 at [redacted]w[redacted]d who presents to maternity admissions reporting frequent pain full contractions.  Initial blood pressure mildly elevated.  No history of elevated blood pressures.  Not currently on blood pressure medications.  No bleeding on RN exams but when RN and CNM return to room at 0558 patient was up to bathroom and had small amount of frank blood running down her legs and on bed pad.  Good fetal movement.   Pregnancy Course:   Past Medical History:  Diagnosis Date   Anemia    Anxiety    OB History  Gravida Para Term Preterm AB Living  5 2 2  0 2 2  SAB IAB Ectopic Multiple Live Births  1 1 0 0 2    # Outcome Date GA Lbr Len/2nd Weight Sex Type Anes PTL Lv  5 Current           4 SAB 10/10/19          3 IAB 2012          2 Term 05/20/10    M Vag-Spont  N LIV  1 Term 01/27/07    Donna Lowery Donna Lowery Donna Lowery LIV   Past Surgical History:  Procedure Laterality Date   BREAST ENHANCEMENT SURGERY Bilateral 01/26/2020   WISDOM TOOTH EXTRACTION Bilateral    Family History  Problem Relation Age of Onset   Healthy Mother    Asthma Sister    Hypertension Maternal Grandmother    Diabetes Maternal Grandmother    Cancer Neg Hx    Social History   Tobacco Use   Smoking status: Every Day    Current packs/day: 0.25    Average packs/day: 0.7 packs/day for 10.8 years (7.7 ttl pk-yrs)    Types: Cigarettes    Start date: 06/2010    Last attempt to quit: 06/2020   Smokeless tobacco: Never   Tobacco comments:    gave her the [employer] literature  Vaping Use   Vaping status: Never Used  Substance Use Topics   Alcohol use: Not Currently    Comment: occasional   Drug use: Never   No Known Allergies Medications Prior to Admission  Medication Sig Dispense Refill Last Dose/Taking   Blood Pressure Monitoring (BLOOD PRESSURE KIT) DEVI 1 Device by  Does not apply route once a week. (Patient not taking: Reported on 02/04/2024) 1 each 0    ferrous sulfate  325 (65 FE) MG tablet Take 1 tablet (325 mg total) by mouth daily. (Patient not taking: Reported on 03/04/2024) 30 tablet 0    Prenat-FeCbn-FeAsp-Meth-FA-DHA (PRENATE MINI ) 18-0.6-0.4-350 MG CAPS Take 1 capsule by mouth daily before breakfast. 90 capsule 4    terconazole  (TERAZOL 3 ) 0.8 % vaginal cream Place 1 applicator vaginally at bedtime. 20 g 0     I have reviewed patient's Past Medical Hx, Surgical Hx, Family Hx, Social Hx, medications and allergies.   ROS:  Review of Systems see HPI  Physical Exam  Patient Vitals for the past 24 hrs:  BP Temp Temp src Pulse Resp SpO2  03/31/24 0525 106/69 -- -- 88 -- --  03/31/24 0454 115/66 -- -- 93 -- --  03/31/24 0430 -- -- -- -- -- 92 %  03/31/24 0420 -- -- -- -- -- 100 %  03/31/24 0410 -- -- -- -- -- 100 %  03/31/24 0405 -- -- -- -- --  100 %  03/31/24 0401 -- -- -- -- -- 99 %  03/31/24 0400 -- -- -- -- -- 99 %  03/31/24 0350 -- -- -- -- -- 100 %  03/31/24 0345 -- -- -- -- -- 100 %  03/31/24 0340 -- -- -- -- -- 100 %  03/31/24 0330 -- -- -- -- -- 99 %  03/31/24 0325 -- -- -- -- -- 100 %  03/31/24 0322 (!) 132/95 (!) 97.5 F (36.4 C) Oral 90 18 --   Constitutional: Well-developed, well-nourished female in moderate distress.  Cardiovascular: normal rate Respiratory: normal effort GI: Abd soft, non-tender, gravid appropriate for gestational age.  MS: Extremities nontender, no edema, normal ROM Neurologic: Alert and oriented x 4.  GU: Neg CVAT.  Pelvic: NEFG, physiologic discharge, small-moderate amount of frank blood,   Dilation: 6 Effacement (%): 100 Cervical Position: Middle Station: -2 Presentation: Vertex Exam by:: Ravonda Brecheen, CNM  FHT:  Baseline 140 , moderate variability, accelerations present, no decelerations.  Tracing very intermittent due to patient position changes. Contractions: q 2-3 mins, strong   Labs: No  results found for this or any previous visit (from the past 24 hours).  Imaging:  N/A  MAU Course/MDM Initial SVE unchanged from MAU visit yesterday.  Patient observed for 2 hours.  Changed to 6 cm and developed increased vaginal bleeding concerning for abruption.  Patient hemodynamically stable.  Fetal heart rate reassuring although tracing intermittent due to patient position changes.  Will admit.  Appropriate to labor currently.  Patient consents to blood transfusion if indicated.  2 units packed RBCs typed and crossed.  Assessment: 1. Supervision of other normal pregnancy, antepartum   2. Labor and delivery indication for care or intervention   3. Vaginal bleeding in pregnancy, third trimester   4. Elevated blood pressure reading without diagnosis of hypertension     Plan: Admit to labor and delivery Routine labor and delivery orders Type and cross 2 units PRBCs May have epidural, IV fentanyl as needed. Report given to Dr. Jomarie including concern for abruption. Pre-E labs given elevated blood pressure x 1 and concern for abruption.   Donna Lowery, Donna Lowery , CNM 03/31/2024 6:10 AM

## 2024-04-01 LAB — CBC
HCT: 33 % — ABNORMAL LOW (ref 36.0–46.0)
Hemoglobin: 11.2 g/dL — ABNORMAL LOW (ref 12.0–15.0)
MCH: 32.3 pg (ref 26.0–34.0)
MCHC: 33.9 g/dL (ref 30.0–36.0)
MCV: 95.1 fL (ref 80.0–100.0)
Platelets: 179 K/uL (ref 150–400)
RBC: 3.47 MIL/uL — ABNORMAL LOW (ref 3.87–5.11)
RDW: 15.1 % (ref 11.5–15.5)
WBC: 13.7 K/uL — ABNORMAL HIGH (ref 4.0–10.5)
nRBC: 0 % (ref 0.0–0.2)

## 2024-04-01 MED ORDER — IBUPROFEN 800 MG PO TABS: 800.0000 mg | ORAL_TABLET | Freq: Three times a day (TID) | ORAL | 0 refills | Status: AC | PRN

## 2024-04-01 MED ORDER — SENNOSIDES-DOCUSATE SODIUM 8.6-50 MG PO TABS: 2.0000 | ORAL_TABLET | Freq: Every day | ORAL | 0 refills | Status: AC

## 2024-04-01 MED ORDER — OXYCODONE HCL 5 MG PO TABS
5.0000 mg | ORAL_TABLET | Freq: Four times a day (QID) | ORAL | Status: DC | PRN
  Administered 2024-04-01: 5 mg via ORAL
  Filled 2024-04-01: qty 1

## 2024-04-01 MED ORDER — ACETAMINOPHEN 325 MG PO TABS: 650.0000 mg | ORAL_TABLET | ORAL | Status: AC | PRN

## 2024-04-01 NOTE — Discharge Instructions (Signed)

## 2024-04-01 NOTE — Patient Instructions (Signed)
 If interested in an outpatient lactation consult in office or virtually please reach out to us  at [facility] (First Floor) [address] [place] Please feel free to out with any lactation related questions or concerns [phone]  to leave a message for our lactation voicemail box.  Donna Lowery, [facility] Center for [facility]

## 2024-04-01 NOTE — Progress Notes (Signed)
 MOB was referred for history of anxiety.  * Referral screened out by Clinical Social Worker because none of the following criteria appear to apply:  ~ History of anxiety during this pregnancy, or of post-partum depression following prior delivery.  ~ Diagnosis of anxiety within last 3 years  Per OB notes, MOB did not indicate any signs/symptoms during her pregnancy.  Edinburgh score 0  Anxiety 2017  OR  * MOB's symptoms currently being treated with medication and/or therapy.  Please contact the Clinical Social Worker if needs arise, by [facility] request, or if MOB scores greater than 9/yes to question 10 on Edinburgh Postpartum Depression Screen.  Rosina Molt, [place] Clinical Social Worker [phone]

## 2024-04-02 ENCOUNTER — Encounter: Admitting: Obstetrics

## 2024-04-18 ENCOUNTER — Telehealth (HOSPITAL_COMMUNITY): Payer: Self-pay

## 2024-04-18 NOTE — Telephone Encounter (Signed)
 04/18/2024 1731  Name: SONIYAH MCGLORY MRN: [id number] DOB: 1986/02/26  Reason for Call:  Transition of Care Hospital Discharge Call  Contact Status: Patient Contact Status: Unable to contact (no answer, no voicemail option)  Language assistant needed:          Follow-Up Questions:    Van Postnatal Depression Scale:  In the Past 7 Days:    PHQ2-9 Depression Scale:     Discharge Follow-up:    Post-discharge interventions: NA  Signature  Rosaline Deretha PEAK

## 2024-04-27 ENCOUNTER — Encounter: Payer: Self-pay | Admitting: Obstetrics and Gynecology

## 2024-05-12 ENCOUNTER — Telehealth: Admitting: Obstetrics and Gynecology

## 2024-05-12 ENCOUNTER — Encounter: Payer: Self-pay | Admitting: Obstetrics and Gynecology

## 2024-05-12 NOTE — Progress Notes (Signed)
   Provider location: Center for [facility]   Patient location: Home  I connected withNAME@ on 05/12/24 at  9:15 AM EST by Mychart Video Encounter and verified that I am speaking with the correct person using two identifiers.       I discussed the limitations, risks, security and privacy concerns of performing an evaluation and management service virtually and the availability of in person appointments. I also discussed with the patient that there may be a patient responsible charge related to this service. The patient expressed understanding and agreed to proceed.  Post Partum Visit Note Subjective:   Donna Lowery is a 38 y.o. [phone] female who presents for a postpartum visit. She is 6 weeks postpartum following a normal spontaneous vaginal delivery.  I have fully reviewed the prenatal and intrapartum course. The delivery was at 38 gestational weeks.  Anesthesia: none. Postpartum course has been uncomplicated. Baby is doing well. Baby is feeding by bottle - Enfamil with Iron . Bleeding no bleeding. Bowel function is normal. Bladder function is normal. Patient is not sexually active. Contraception method is abstinence. Postpartum depression screening: negative.   The pregnancy intention screening data noted above was reviewed. Potential methods of contraception were discussed. The patient elected to proceed with No data recorded.       Review of Systems Pertinent items are noted in HPI.  Objective:  LMP 07/08/2023 (Approximate)     General:  Alert, oriented and cooperative. Patient is in no acute distress.  Respiratory: Normal respiratory effort, no problems with respiration noted  Mental Status: Normal mood and affect. Normal behavior. Normal judgment and thought content.  Rest of physical exam deferred due to type of encounter   Assessment:    Normal postpartum exam.  Plan:  Essential components of care per ACOG recommendations:  1.  Mood and well being:  Patient with negative depression screening today. Reviewed local resources for support.  - Patient does not use tobacco.  - hx of drug use? No    2. Infant care and feeding:  -Patient currently breastmilk feeding? No  -Social determinants of health (SDOH) reviewed in EPIC. No concerns  3. Sexuality, contraception and birth spacing - Patient does not want a pregnancy in the next year.  Desired family size is 3 children.  - Reviewed forms of contraception in tiered fashion. Patient desired condoms, natural family planning (NFP) today.   - Discussed birth spacing of 18 months  4. Sleep and fatigue -Encouraged family/partner/community support of 4 hrs of uninterrupted sleep to help with mood and fatigue  5. Physical Recovery  - Discussed patients delivery and complications - Patient had a 1 degree laceration, perineal healing reviewed. Patient expressed understanding - Patient has urinary incontinence? No  - Patient is safe to resume physical and sexual activity  6.  Health Maintenance - Last pap smear done 09/2023 and was abnormal with LGSIL  Plan for follow up for colposcopy and patient desires it done early 2026   I provided 15 minutes of face-to-face time during this encounter.    Return in about 1 month (around 06/12/2024).  No future appointments.  Winton Felt, MD Center for [employer], [facility]
